# Patient Record
Sex: Female | Born: 1950
Health system: Southern US, Community
[De-identification: ages and names within clinical notes are randomized; demographics above are authoritative.]

## PROBLEM LIST (undated history)

## (undated) DIAGNOSIS — B1910 Unspecified viral hepatitis B without hepatic coma: Secondary | ICD-10-CM

## (undated) DIAGNOSIS — R011 Cardiac murmur, unspecified: Secondary | ICD-10-CM

## (undated) DIAGNOSIS — D649 Anemia, unspecified: Secondary | ICD-10-CM

## (undated) DIAGNOSIS — Z923 Personal history of irradiation: Secondary | ICD-10-CM

## (undated) DIAGNOSIS — C189 Malignant neoplasm of colon, unspecified: Secondary | ICD-10-CM

## (undated) DIAGNOSIS — I499 Cardiac arrhythmia, unspecified: Secondary | ICD-10-CM

## (undated) DIAGNOSIS — D219 Benign neoplasm of connective and other soft tissue, unspecified: Secondary | ICD-10-CM

## (undated) DIAGNOSIS — Z8489 Family history of other specified conditions: Secondary | ICD-10-CM

## (undated) DIAGNOSIS — N809 Endometriosis, unspecified: Secondary | ICD-10-CM

## (undated) DIAGNOSIS — C801 Malignant (primary) neoplasm, unspecified: Secondary | ICD-10-CM

## (undated) DIAGNOSIS — B1920 Unspecified viral hepatitis C without hepatic coma: Secondary | ICD-10-CM

## (undated) DIAGNOSIS — C50919 Malignant neoplasm of unspecified site of unspecified female breast: Secondary | ICD-10-CM

## (undated) DIAGNOSIS — J302 Other seasonal allergic rhinitis: Secondary | ICD-10-CM

## (undated) DIAGNOSIS — M1990 Unspecified osteoarthritis, unspecified site: Secondary | ICD-10-CM

## (undated) HISTORY — PX: TUBAL LIGATION: SHX77

## (undated) HISTORY — DX: Unspecified viral hepatitis C without hepatic coma: B19.20

## (undated) HISTORY — DX: Endometriosis, unspecified: N80.9

## (undated) HISTORY — DX: Cardiac murmur, unspecified: R01.1

## (undated) HISTORY — PX: ABDOMINAL HYSTERECTOMY: SHX81

## (undated) HISTORY — DX: Unspecified viral hepatitis B without hepatic coma: B19.10

## (undated) HISTORY — DX: Benign neoplasm of connective and other soft tissue, unspecified: D21.9

## (undated) HISTORY — PX: COLONOSCOPY: SHX174

## (undated) HISTORY — DX: Malignant neoplasm of colon, unspecified: C18.9

## (undated) HISTORY — DX: Unspecified osteoarthritis, unspecified site: M19.90

---

## 1997-06-02 DIAGNOSIS — B1920 Unspecified viral hepatitis C without hepatic coma: Secondary | ICD-10-CM

## 1997-06-02 HISTORY — DX: Unspecified viral hepatitis C without hepatic coma: B19.20

## 2003-06-03 DIAGNOSIS — C189 Malignant neoplasm of colon, unspecified: Secondary | ICD-10-CM

## 2003-06-03 DIAGNOSIS — C801 Malignant (primary) neoplasm, unspecified: Secondary | ICD-10-CM

## 2003-06-03 HISTORY — DX: Malignant neoplasm of colon, unspecified: C18.9

## 2003-06-03 HISTORY — DX: Malignant (primary) neoplasm, unspecified: C80.1

## 2004-10-15 ENCOUNTER — Ambulatory Visit: Payer: Self-pay | Admitting: Family Medicine

## 2004-10-18 ENCOUNTER — Ambulatory Visit: Payer: Self-pay | Admitting: Gastroenterology

## 2004-11-04 ENCOUNTER — Ambulatory Visit: Payer: Self-pay | Admitting: Gastroenterology

## 2005-12-02 ENCOUNTER — Ambulatory Visit: Payer: Self-pay | Admitting: Gastroenterology

## 2007-08-31 ENCOUNTER — Ambulatory Visit: Payer: Self-pay | Admitting: Family Medicine

## 2008-08-31 ENCOUNTER — Ambulatory Visit: Payer: Self-pay | Admitting: Internal Medicine

## 2008-09-12 ENCOUNTER — Ambulatory Visit: Payer: Self-pay | Admitting: Internal Medicine

## 2009-07-20 ENCOUNTER — Ambulatory Visit: Payer: Self-pay | Admitting: Internal Medicine

## 2010-04-24 ENCOUNTER — Ambulatory Visit: Payer: Self-pay | Admitting: Gastroenterology

## 2010-05-06 ENCOUNTER — Ambulatory Visit: Payer: Self-pay | Admitting: Gastroenterology

## 2012-05-20 ENCOUNTER — Ambulatory Visit: Payer: Self-pay | Admitting: Internal Medicine

## 2014-04-26 DIAGNOSIS — Z8619 Personal history of other infectious and parasitic diseases: Secondary | ICD-10-CM | POA: Insufficient documentation

## 2014-12-06 ENCOUNTER — Other Ambulatory Visit: Payer: Self-pay | Admitting: Internal Medicine

## 2014-12-06 DIAGNOSIS — Z1231 Encounter for screening mammogram for malignant neoplasm of breast: Secondary | ICD-10-CM

## 2015-04-23 DIAGNOSIS — B182 Chronic viral hepatitis C: Secondary | ICD-10-CM | POA: Insufficient documentation

## 2015-04-23 DIAGNOSIS — C801 Malignant (primary) neoplasm, unspecified: Secondary | ICD-10-CM | POA: Insufficient documentation

## 2015-06-03 DIAGNOSIS — C50919 Malignant neoplasm of unspecified site of unspecified female breast: Secondary | ICD-10-CM

## 2015-06-03 DIAGNOSIS — Z923 Personal history of irradiation: Secondary | ICD-10-CM

## 2015-06-03 HISTORY — DX: Malignant neoplasm of unspecified site of unspecified female breast: C50.919

## 2015-06-03 HISTORY — DX: Personal history of irradiation: Z92.3

## 2015-08-02 ENCOUNTER — Ambulatory Visit
Admission: RE | Admit: 2015-08-02 | Discharge: 2015-08-02 | Disposition: A | Discharge status: Home / Self Care | Payer: BC Managed Care – PPO | Source: Ambulatory Visit | Attending: Internal Medicine | Admitting: Internal Medicine

## 2015-08-02 DIAGNOSIS — Z1231 Encounter for screening mammogram for malignant neoplasm of breast: Secondary | ICD-10-CM | POA: Diagnosis not present

## 2015-08-02 HISTORY — DX: Malignant (primary) neoplasm, unspecified: C80.1

## 2015-08-06 ENCOUNTER — Other Ambulatory Visit: Payer: Self-pay | Admitting: Internal Medicine

## 2015-08-06 DIAGNOSIS — R928 Other abnormal and inconclusive findings on diagnostic imaging of breast: Secondary | ICD-10-CM

## 2015-08-09 ENCOUNTER — Other Ambulatory Visit: Payer: Self-pay | Admitting: Internal Medicine

## 2015-08-09 ENCOUNTER — Ambulatory Visit
Admission: RE | Admit: 2015-08-09 | Discharge: 2015-08-09 | Disposition: A | Discharge status: Home / Self Care | Payer: BC Managed Care – PPO | Source: Ambulatory Visit | Attending: Internal Medicine | Admitting: Internal Medicine

## 2015-08-09 DIAGNOSIS — R928 Other abnormal and inconclusive findings on diagnostic imaging of breast: Secondary | ICD-10-CM | POA: Diagnosis present

## 2015-08-09 DIAGNOSIS — C50912 Malignant neoplasm of unspecified site of left female breast: Secondary | ICD-10-CM | POA: Insufficient documentation

## 2015-08-09 DIAGNOSIS — N6320 Unspecified lump in the left breast, unspecified quadrant: Secondary | ICD-10-CM

## 2015-08-09 DIAGNOSIS — N63 Unspecified lump in breast: Secondary | ICD-10-CM | POA: Insufficient documentation

## 2015-08-09 HISTORY — PX: BREAST BIOPSY: SHX20

## 2015-08-10 ENCOUNTER — Ambulatory Visit: Payer: BC Managed Care – PPO

## 2015-08-17 ENCOUNTER — Inpatient Hospital Stay: Admission: RE | Admit: 2015-08-17 | Payer: BC Managed Care – PPO | Source: Ambulatory Visit

## 2015-08-17 ENCOUNTER — Ambulatory Visit: Payer: BC Managed Care – PPO

## 2015-08-20 ENCOUNTER — Encounter: Payer: Self-pay | Admitting: Oncology

## 2015-08-20 ENCOUNTER — Encounter: Payer: Self-pay | Admitting: *Deleted

## 2015-08-20 ENCOUNTER — Inpatient Hospital Stay: Payer: BC Managed Care – PPO | Attending: Oncology | Admitting: Oncology

## 2015-08-20 VITALS — BP 135/92 | HR 102 | Temp 98.2°F | Resp 18 | Wt <= 1120 oz

## 2015-08-20 DIAGNOSIS — Z87891 Personal history of nicotine dependence: Secondary | ICD-10-CM | POA: Insufficient documentation

## 2015-08-20 DIAGNOSIS — R011 Cardiac murmur, unspecified: Secondary | ICD-10-CM | POA: Diagnosis not present

## 2015-08-20 DIAGNOSIS — F419 Anxiety disorder, unspecified: Secondary | ICD-10-CM

## 2015-08-20 DIAGNOSIS — Z79899 Other long term (current) drug therapy: Secondary | ICD-10-CM | POA: Insufficient documentation

## 2015-08-20 DIAGNOSIS — C50412 Malignant neoplasm of upper-outer quadrant of left female breast: Secondary | ICD-10-CM | POA: Diagnosis not present

## 2015-08-20 DIAGNOSIS — Z8619 Personal history of other infectious and parasitic diseases: Secondary | ICD-10-CM | POA: Insufficient documentation

## 2015-08-20 DIAGNOSIS — M129 Arthropathy, unspecified: Secondary | ICD-10-CM | POA: Insufficient documentation

## 2015-08-20 DIAGNOSIS — Z85038 Personal history of other malignant neoplasm of large intestine: Secondary | ICD-10-CM | POA: Diagnosis not present

## 2015-08-20 DIAGNOSIS — Z170 Estrogen receptor positive status [ER+]: Secondary | ICD-10-CM | POA: Diagnosis not present

## 2015-08-20 DIAGNOSIS — L988 Other specified disorders of the skin and subcutaneous tissue: Secondary | ICD-10-CM | POA: Diagnosis not present

## 2015-08-20 NOTE — Progress Notes (Signed)
  Oncology Nurse Navigator Documentation  Navigator Location: [facility]Med Onc (08/20/15 1000) Navigator Encounter Type: Initial MedOnc (08/20/15 1000)   Abnormal Finding Date: 08/09/15 (08/20/15 1000) Confirmed Diagnosis Date: 08/14/15 (08/20/15 1000)     Patient Visit Type: MedOnc (08/20/15 1000)   Barriers/Navigation Needs: Coordination of Care;Education (08/20/15 1000) Education: Coping with Diagnosis/ Prognosis;Newly Diagnosed Cancer Education;lymphedema class (08/20/15 1000)              Acuity: Level 2 (08/20/15 1000)         Time Spent with Patient: 90 (08/20/15 1000)   Met with patient and her husband today during her initial medical oncology consultation.  Gave patient breast cancer educational literature, "My Breast Cancer Treatment Handbook" by Josephine Igo, RN.  Offered support.  Scheduled patient to see Dr. Tamala Julian on 08/22/15 @ 4:00 for her surgical consultation.  States he will biopsy the left nipple at that time.  Informed patient of appointment.  Patient is to call if she has any questions or needs.

## 2015-08-20 NOTE — Progress Notes (Signed)
Patient here today as new evaluation regarding left breast cancer.  Patient referred by Dr. Vonzella Nipple.  Patient has had Hep B and now also Hep C.  Patient had bx and [place] on 3-9 with Dr. Manuella Ghazi.

## 2015-08-22 LAB — SURGICAL PATHOLOGY

## 2015-08-23 ENCOUNTER — Encounter: Payer: Self-pay | Admitting: *Deleted

## 2015-08-23 NOTE — Progress Notes (Addendum)
Patient called yesterday wanting her Her2 results.  They were not available until today.  I have left her a message to return my call.  Patient returned my call.  Informed her of her negative Her2 results.  She was excited.  She is to follow-up with Dr. Grayland Ormond after surgery for final treatment plan.

## 2015-08-24 ENCOUNTER — Other Ambulatory Visit: Payer: Self-pay | Admitting: Surgery

## 2015-08-24 ENCOUNTER — Ambulatory Visit: Payer: BC Managed Care – PPO

## 2015-08-24 DIAGNOSIS — C50412 Malignant neoplasm of upper-outer quadrant of left female breast: Secondary | ICD-10-CM

## 2015-08-28 ENCOUNTER — Encounter: Payer: Self-pay | Admitting: *Deleted

## 2015-08-28 ENCOUNTER — Other Ambulatory Visit: Payer: BC Managed Care – PPO

## 2015-08-28 NOTE — Patient Instructions (Signed)
  Your procedure is scheduled on: 09-04-15 (TUESDAY) Report to [facility] @ 8 AM   Remember: Instructions that are not followed completely may result in serious medical risk, up to and including death, or upon the discretion of your surgeon and anesthesiologist your surgery may need to be rescheduled.    _X___ 1. Do not eat food or drink liquids after midnight. No gum chewing or hard candies.     _X___ 2. No Alcohol for 24 hours before or after surgery.   ____ 3. Bring all medications with you on the day of surgery if instructed.    _X___ 4. Notify your doctor if there is any change in your medical condition     (cold, fever, infections).     Do not wear jewelry, make-up, hairpins, clips or nail polish.  Do not wear lotions, powders, or perfumes. You may wear deodorant.  Do not shave 48 hours prior to surgery. Men may shave face and neck.  Do not bring valuables to the hospital.    [facility] is not responsible for any belongings or valuables.               Contacts, dentures or bridgework may not be worn into surgery.  Leave your suitcase in the car. After surgery it may be brought to your room.  For patients admitted to the hospital, discharge time is determined by your treatment team.   Patients discharged the day of surgery will not be allowed to drive home.   Please read over the following fact sheets that you were given:      ____ Take these medicines the morning of surgery with A SIP OF WATER:    1. MAY TAKE ATIVAN IF NEEDED DAY OF SURGERY WITH A SMALL SIP OF WATER  2.   3.   4.  5.  6.  ____ Fleet Enema (as directed)   _X___ Use CHG Soap as directed  ____ Use inhalers on the day of surgery  ____ Stop metformin 2 days prior to surgery    ____ Take 1/2 of usual insulin dose the night before surgery and none on the morning of surgery.   ____ Stop Coumadin/Plavix/aspirin-N/A  _X___ Stop Anti-inflammatories-STOP ALEVE 19 HOURS PRIOR TO SURGERY PER DR  Tamala Julian   ____ Stop supplements until after surgery.    ____ Bring C-Pap to the hospital.

## 2015-08-29 ENCOUNTER — Encounter
Admission: RE | Admit: 2015-08-29 | Discharge: 2015-08-29 | Disposition: A | Discharge status: Home / Self Care | Payer: BC Managed Care – PPO | Source: Ambulatory Visit | Attending: Surgery | Admitting: Surgery

## 2015-08-29 DIAGNOSIS — R002 Palpitations: Secondary | ICD-10-CM | POA: Insufficient documentation

## 2015-08-29 DIAGNOSIS — Z01810 Encounter for preprocedural cardiovascular examination: Secondary | ICD-10-CM | POA: Insufficient documentation

## 2015-08-29 DIAGNOSIS — Z01812 Encounter for preprocedural laboratory examination: Secondary | ICD-10-CM | POA: Insufficient documentation

## 2015-08-29 LAB — PROTIME-INR
INR: 0.95
PROTHROMBIN TIME: 12.9 s (ref 11.4–15.0)

## 2015-09-02 DIAGNOSIS — C50412 Malignant neoplasm of upper-outer quadrant of left female breast: Secondary | ICD-10-CM | POA: Insufficient documentation

## 2015-09-02 NOTE — Progress Notes (Signed)
[place]  Telephone:(336) [phone] Fax:(336) [phone]  ID: Debra Joseph OB: 09-07-1950  MR#: [id number]  MPN#:[id number]  Patient Care Team: Madelyn Brunner, MD as PCP - General (Internal Medicine)  CHIEF COMPLAINT:  Chief Complaint  Patient presents with  . New Evaluation    INTERVAL HISTORY: Patient is a 65 year old female who was noted to have a suspicious lesion on routine screening mammogram of her left breast. Subsequent biopsy revealed invasive adenocarcinoma. Currently patient is anxious, but otherwise feels well. She has no neurologic complaint. She denies any recent fevers or illnesses. She denies any pain. She has no chest pain or shortness of breath. She denies any nausea, vomiting, constipation, or diarrhea. She has no urinary complaints. Patient otherwise feels well and offers no further specific complaints.  REVIEW OF SYSTEMS:   Review of Systems  Constitutional: Negative.  Negative for fever, weight loss and malaise/fatigue.  Respiratory: Negative.  Negative for sputum production.   Cardiovascular: Negative.  Negative for chest pain.  Gastrointestinal: Negative.   Genitourinary: Negative.   Musculoskeletal: Negative.   Neurological: Negative.  Negative for weakness.  Psychiatric/Behavioral: The patient is nervous/anxious.     As per HPI. Otherwise, a complete review of systems is negatve.  PAST MEDICAL HISTORY: Past Medical History  Diagnosis Date  . Cancer ([place]) 2005    COLON CA  . Colon cancer ([place])   . Arthritis   . Heart murmur   . Hepatitis B     AGE 85  . Hepatitis C 1999    NO CIRRHOSIS  . Anemia   . Family history of adverse reaction to anesthesia     MOM-NAUSEATED  . Seasonal allergies   . Dysrhythmia     PALPITATIONS    PAST SURGICAL HISTORY: Past Surgical History  Procedure Laterality Date  . Breast biopsy Left 08/09/2015    2 areas path pending  . Tubal ligation    . Abdominal hysterectomy    . Colonoscopy        MULTIPLE    FAMILY HISTORY Family History  Problem Relation Age of Onset  . Breast cancer Neg Hx        ADVANCED DIRECTIVES:    HEALTH MAINTENANCE: Social History  Substance Use Topics  . Smoking status: Former Smoker -- 0.50 packs/day for 35 years    Types: E-cigarettes  . Smokeless tobacco: Former Systems developer    Quit date: 08/19/2012     Comment: VAPOR CIG   . Alcohol Use: No     Colonoscopy:  PAP:  Bone density:  Lipid panel:  Allergies  Allergen Reactions  . Other Other (See Comments)    DARVOCET    Current Outpatient Prescriptions  Medication Sig Dispense Refill  . LORazepam (ATIVAN) 0.5 MG tablet Take 0.5 mg by mouth every 8 (eight) hours as needed.     . Multiple Vitamins-Minerals (CENTRUM ULTRA WOMENS PO) Take 1 tablet by mouth daily.     . diphenhydrAMINE (BENADRYL) 25 MG tablet Take 25 mg by mouth every 6 (six) hours as needed.    . fluticasone (FLONASE) 50 MCG/ACT nasal spray Place 2 sprays into both nostrils as needed for allergies or rhinitis.    . naproxen sodium (RA NAPROXEN SODIUM) 220 MG tablet Take by mouth.     No current facility-administered medications for this visit.    OBJECTIVE: Filed Vitals:   08/20/15 0907  BP: 135/92  Pulse: 102  Temp: 98.2 F (36.8 C)  Resp: 18  There is no height on file to calculate BMI.    ECOG FS:0 - Asymptomatic  General: Well-developed, well-nourished, no acute distress. Eyes: Pink conjunctiva, anicteric sclera. HEENT: Normocephalic, moist mucous membranes, clear oropharnyx. Breasts: Minimally palpable abnormality left breast. Lungs: Clear to auscultation bilaterally. Heart: Regular rate and rhythm. No rubs, murmurs, or gallops. Abdomen: Soft, nontender, nondistended. No organomegaly noted, normoactive bowel sounds. Musculoskeletal: No edema, cyanosis, or clubbing. Neuro: Alert, answering all questions appropriately. Cranial nerves grossly intact. Skin: Mildly erythematous subcentimeter  lesion on  breast. Psych: Normal affect. Lymphatics: No cervical, calvicular, axillary or inguinal LAD.   LAB RESULTS:  No results found for: NA, K, CL, CO2, GLUCOSE, BUN, CREATININE, CALCIUM, PROT, ALBUMIN, AST, ALT, ALKPHOS, BILITOT, GFRNONAA, GFRAA  No results found for: WBC, NEUTROABS, HGB, HCT, MCV, PLT   STUDIES: Mm Digital Diagnostic Unilat L  08/09/2015  CLINICAL DATA:  65 year old female status post ultrasound-guided biopsies of left breast masses at 1 o'clock, 6 cm from the nipple and at 1 o'clock, 7 cm from the nipple EXAM: DIAGNOSTIC LEFT MAMMOGRAM POST ULTRASOUND BIOPSY COMPARISON:  Previous exam(s). FINDINGS: Mammographic images were obtained following ultrasound guided biopsies of left breast masses at 1 o'clock, 6 cm from the nipple and 1 o'clock, 7 cm from the nipple. The wing shaped biopsy marker is in the location of the distortion within the upper, outer left breast. The coil shaped biopsy marker is thought to be approximately 1 cm lateral to the expected location within the upper, outer left breast. IMPRESSION: Marker placement as above. Final Assessment: Post Procedure Mammograms for Marker Placement Electronically Signed   By: Pamelia Hoit M.D.   On: 08/09/2015 14:19   US Breast Ltd Uni Left Inc Axilla  08/09/2015  ADDENDUM REPORT: 08/09/2015 16:20 ADDENDUM: At the time of exam, the patient also noted that she had a skin lesion of the left superior areola which was evaluated by Dr. Nehemiah Massed in January. She states that the area was felt likely benign and was treated with liquid nitrogen. Estill Bamberg, a patient care coordinator in Dr. Alveria Apley office was contacted via telephone by Dr. Brigitte Pulse. She stated that the patient missed her follow-up appointment in February. She will let Dr. Nehemiah Massed know that the patient had breast biopsies today, and they will reach out to the patient to reschedule her follow-up appointment if deemed necessary. If either of the patient's breast biopsies demonstrate  malignancy, consideration of biopsy of the areolar abnormality is recommended. Electronically Signed   By: Pamelia Hoit M.D.   On: 08/09/2015 16:20  08/09/2015  ADDENDUM REPORT: 08/09/2015 16:20 ADDENDUM: At the time of exam, the patient also noted that she had a skin lesion of the left superior areola which was evaluated by Dr. Nehemiah Massed in January. She states that the area was felt likely benign and was treated with liquid nitrogen. Estill Bamberg, a patient care coordinator in Dr. Alveria Apley office was contacted via telephone by Dr. Brigitte Pulse. She stated that the patient missed her follow-up appointment in February. She will let Dr. Nehemiah Massed know that the patient had breast biopsies today, and they will reach out to the patient to reschedule her follow-up appointment if deemed necessary. If either of the patient's breast biopsies demonstrate malignancy, consideration of biopsy of the areolar abnormality is recommended. Electronically Signed   By: Pamelia Hoit M.D.   On: 08/09/2015 16:20  08/09/2015  CLINICAL DATA:  65 year old female, callback from screening mammogram for possible left breast distortion EXAM: DIGITAL DIAGNOSTIC LEFT MAMMOGRAM WITH 3D TOMOSYNTHESIS  WITH CAD ULTRASOUND LEFT BREAST COMPARISON:  08/02/2015, 05/20/2012, additional prior studies dating back to 08/31/2007 ACR Breast Density Category c: The breast tissue is heterogeneously dense, which may obscure small masses. FINDINGS: CC, MLO, and spot compression CC and MLO views of the left breast with tomosynthesis were performed. A distortion with associated calcifications is noted within the upper, outer left breast, posterior depth, corresponding to the questioned abnormality on the screening mammogram. Mammographic images were processed with CAD. On physical exam, an area of thickening is palpated in the upper, outer left breast at 1 o'clock, 6 cm from the nipple. Targeted ultrasound is performed, showing an irregular, hypoechoic mass at 1 o'clock, 6 cm from the  nipple measuring 1.9 x 1.1 x 1.8 cm. An additional irregular, hypoechoic lesion is noted at 1 o'clock, 7 cm from the nipple measuring 6 x 4 x 4 mm. This mass is approximately 1.1 cm from the larger mass at 1 o'clock, 6 cm from the nipple. No enlarged axillary lymph nodes are identified. IMPRESSION: 1. Highly suspicious left breast mass at 1 o'clock, 6 cm from the nipple. 2. Indeterminate left breast mass at 1 o'clock, 7 cm from the nipple. RECOMMENDATION: Ultrasound-guided biopsies of the left breast masses at 1 o'clock, 6 cm from the nipple and 1 o'clock, 7 cm from the nipple. I have discussed the findings and recommendations with the patient. Results were also provided in writing at the conclusion of the visit. If applicable, a reminder letter will be sent to the patient regarding the next appointment. BI-RADS CATEGORY  5: Highly suggestive of malignancy. Electronically Signed: By: Pamelia Hoit M.D. On: 08/09/2015 12:01   Mm Diag Breast Tomo Uni Left  08/09/2015  ADDENDUM REPORT: 08/09/2015 16:20 ADDENDUM: At the time of exam, the patient also noted that she had a skin lesion of the left superior areola which was evaluated by Dr. Nehemiah Massed in January. She states that the area was felt likely benign and was treated with liquid nitrogen. Estill Bamberg, a patient care coordinator in Dr. Alveria Apley office was contacted via telephone by Dr. Brigitte Pulse. She stated that the patient missed her follow-up appointment in February. She will let Dr. Nehemiah Massed know that the patient had breast biopsies today, and they will reach out to the patient to reschedule her follow-up appointment if deemed necessary. If either of the patient's breast biopsies demonstrate malignancy, consideration of biopsy of the areolar abnormality is recommended. Electronically Signed   By: Pamelia Hoit M.D.   On: 08/09/2015 16:20  08/09/2015  ADDENDUM REPORT: 08/09/2015 16:20 ADDENDUM: At the time of exam, the patient also noted that she had a skin lesion of the left  superior areola which was evaluated by Dr. Nehemiah Massed in January. She states that the area was felt likely benign and was treated with liquid nitrogen. Estill Bamberg, a patient care coordinator in Dr. Alveria Apley office was contacted via telephone by Dr. Brigitte Pulse. She stated that the patient missed her follow-up appointment in February. She will let Dr. Nehemiah Massed know that the patient had breast biopsies today, and they will reach out to the patient to reschedule her follow-up appointment if deemed necessary. If either of the patient's breast biopsies demonstrate malignancy, consideration of biopsy of the areolar abnormality is recommended. Electronically Signed   By: Pamelia Hoit M.D.   On: 08/09/2015 16:20  08/09/2015  CLINICAL DATA:  65 year old female, callback from screening mammogram for possible left breast distortion EXAM: DIGITAL DIAGNOSTIC LEFT MAMMOGRAM WITH 3D TOMOSYNTHESIS WITH CAD ULTRASOUND LEFT BREAST COMPARISON:  08/02/2015, 05/20/2012, additional prior studies dating back to 08/31/2007 ACR Breast Density Category c: The breast tissue is heterogeneously dense, which may obscure small masses. FINDINGS: CC, MLO, and spot compression CC and MLO views of the left breast with tomosynthesis were performed. A distortion with associated calcifications is noted within the upper, outer left breast, posterior depth, corresponding to the questioned abnormality on the screening mammogram. Mammographic images were processed with CAD. On physical exam, an area of thickening is palpated in the upper, outer left breast at 1 o'clock, 6 cm from the nipple. Targeted ultrasound is performed, showing an irregular, hypoechoic mass at 1 o'clock, 6 cm from the nipple measuring 1.9 x 1.1 x 1.8 cm. An additional irregular, hypoechoic lesion is noted at 1 o'clock, 7 cm from the nipple measuring 6 x 4 x 4 mm. This mass is approximately 1.1 cm from the larger mass at 1 o'clock, 6 cm from the nipple. No enlarged axillary lymph nodes are  identified. IMPRESSION: 1. Highly suspicious left breast mass at 1 o'clock, 6 cm from the nipple. 2. Indeterminate left breast mass at 1 o'clock, 7 cm from the nipple. RECOMMENDATION: Ultrasound-guided biopsies of the left breast masses at 1 o'clock, 6 cm from the nipple and 1 o'clock, 7 cm from the nipple. I have discussed the findings and recommendations with the patient. Results were also provided in writing at the conclusion of the visit. If applicable, a reminder letter will be sent to the patient regarding the next appointment. BI-RADS CATEGORY  5: Highly suggestive of malignancy. Electronically Signed: By: Pamelia Hoit M.D. On: 08/09/2015 12:01   [place] Lt Breast Bx W Loc Dev 1st Lesion Img Bx Spec US Guide  08/15/2015  ADDENDUM REPORT: 08/15/2015 14:40 ADDENDUM: Pathology of the left breast revealed A. BREAST, LEFT 1:00, 6 CM FN; BIOPSY: INVASIVE MAMMARY CARCINOMA, NO SPECIAL TYPE. PRELIMINARY GRADE: 1 ([place] GRADE) Comment: The definitive grade will be assigned on the excisional specimen. These findings were communicated to [facility] in Dr. Thomes Dinning office on 08/10/2015 by pathologist. Read back procedure was performed. This was found to be concordant by Dr. Brigitte Pulse. Recommendations:  Surgical and oncology referral. The patient was contacted by Jetta Lout, [facility]) on 08/15/15. She stated she has done well following the biopsy with only a small bruise, but no bleeding or palpable hematoma. Post biopsy instructions were reviewed with the patient. All of her questions were answered. She was encouraged to call the [place] [facility] with any further questions or concerns. The patient stated she had been given the results by Dr. Gilford Rile on 08/13/15. She is awaiting an appointment at the [facility]. Addendum by Jetta Lout, RRA on 08/15/15. Electronically Signed   By: Pamelia Hoit M.D.   On: 08/15/2015 14:40  08/15/2015  CLINICAL DATA:   65 year old female for ultrasound-guided biopsy of a left breast mass at 1 o'clock, 6 cm from the nipple EXAM: ULTRASOUND GUIDED LEFT BREAST CORE NEEDLE BIOPSY COMPARISON:  Previous exam(s). FINDINGS: I met with the patient and we discussed the procedure of ultrasound-guided biopsy, including benefits and alternatives. We discussed the high likelihood of a successful procedure. We discussed the risks of the procedure, including infection, bleeding, tissue injury, clip migration, and inadequate sampling. Informed written consent was given. The usual time-out protocol was performed immediately prior to the procedure. Using sterile technique and 2% Lidocaine as local anesthetic, under direct ultrasound visualization, a 14 gauge spring-loaded device was used to perform  biopsy of a suspicious left breast mass at 1 o'clock, 6 cm from the nipple using a lateral to medial approach. At the conclusion of the procedure a wing shaped tissue marker clip was deployed into the biopsy cavity. Follow up 2 view mammogram was performed and dictated separately. IMPRESSION: Ultrasound guided biopsy of a suspicious left breast mass at 1 o'clock, 6 cm from the nipple. No apparent complications. Electronically Signed: By: Pamelia Hoit M.D. On: 08/09/2015 13:52   [place] Lt Breast Bx W Loc Dev Ea Add Lesion Img Bx Spec US Guide  08/15/2015  ADDENDUM REPORT: 08/15/2015 14:33 ADDENDUM: Pathology of the left breast biopsy revealed B. BREAST, LEFT 1:00, 7 CM FN; BIOPSY: BENIGN ADIPOSE TISSUE. Comment: The presence of benign adipose tissue may represent an intramammary lipoma. Correlation with radiographic findings is required. This was found to be concordant by Dr. Raul Del impression and notes and confirmed by Dr. Joneen Caraway. Recommendations: Surgical and oncology referral for positive findings in left breast at 6 CM from nipple. The patient was contacted by Jetta Lout, [facility]) on 08/15/15 for a post biopsy check. She stated she has  done well following the biopsy with only a small bruise but no bleeding or palpable hematoma. Post biopsy instructions were reviewed with the patient. All of her questions were answered. She was encouraged to call the [place] [facility] with any further questions or concerns. She stated she was contacted by Dr. Gilford Rile on 08/13/15 with the results. She is awaiting a referral to the [facility]. Addendum by Jetta Lout, RRA on 08/15/15. Electronically Signed   By: Pamelia Hoit M.D.   On: 08/15/2015 14:33  08/15/2015  CLINICAL DATA:  65 year old female for ultrasound-guided biopsy of an indeterminate left breast mass at 1 o'clock, 7 cm from the nipple EXAM: ULTRASOUND GUIDED LEFT BREAST CORE NEEDLE BIOPSY COMPARISON:  Previous exam(s). PROCEDURE: I met with the patient and we discussed the procedure of ultrasound-guided biopsy, including benefits and alternatives. We discussed the high likelihood of a successful procedure. We discussed the risks of the procedure including infection, bleeding, tissue injury, clip migration, and inadequate sampling. Informed written consent was given. The usual time-out protocol was performed immediately prior to the procedure. Using sterile technique and 2% Lidocaine as local anesthetic, under direct ultrasound visualization, a 12 gauge vacuum-assisted device was used to perform biopsy of an indeterminate left breast mass at 1 o'clock, 7 cm from the nipple using a lateral to medial approach. At the conclusion of the procedure, a coil shaped tissue marker clip was deployed into the biopsy cavity. Follow-up 2-view mammogram was performed and dictated separately. IMPRESSION: Ultrasound-guided biopsy of an indeterminate left breast mass at 1 o'clock, 7 cm from the nipple. No apparent complications. Electronically Signed: By: Pamelia Hoit M.D. On: 08/09/2015 14:16    ASSESSMENT: Clinical stage Ia ER/PR positive, HER-2 negative  adenocarcinoma of the left breast.  PLAN:    1. Breast cancer: Given the stage in size of patient's malignancy on mammogram, have recommended surgical consultation and lumpectomy prior to other treatment. Patient was reported as HER-2 2+, but reflex to FISH revealed ratio of 1.13 therefore negative. Will send Oncotype DX to determine if chemotherapy is necessary. If patient proceeds with lumpectomy she will require adjuvant XRT. Given the ER/PR status of her tumor, patient will also benefit from an aromatase inhibitor for 5 years at the conclusions of her treatments. Patient will return to clinic one to 2  weeks after her surgery to discuss her final pathology results and additional treatment planning. 2. Left breast skin lesion: Does not appear to be malignant, but have recommended consideration of biopsy by surgery.  Approximately 45 minutes was spent in discussion of which greater than 50% was consultation.  Patient expressed understanding and was in agreement with this plan. She also understands that She can call clinic at any time with any questions, concerns, or complaints.   Breast cancer, left [facility])   Staging form: Breast, AJCC 7th Edition     Clinical stage from 09/02/2015: Stage IA (T1c, N0, M0) - Signed by Lloyd Huger, MD on 09/02/2015   Lloyd Huger, MD   09/02/2015 8:55 AM

## 2015-09-03 ENCOUNTER — Encounter: Payer: Self-pay | Admitting: Diagnostic Radiology

## 2015-09-03 ENCOUNTER — Encounter: Payer: Self-pay | Admitting: *Deleted

## 2015-09-04 ENCOUNTER — Ambulatory Visit: Payer: BC Managed Care – PPO | Admitting: Anesthesiology

## 2015-09-04 ENCOUNTER — Encounter
Admission: RE | Disposition: A | Discharge status: Home / Self Care | Payer: Self-pay | Source: Ambulatory Visit | Attending: Surgery

## 2015-09-04 ENCOUNTER — Ambulatory Visit
Admission: RE | Admit: 2015-09-04 | Discharge: 2015-09-04 | Disposition: A | Discharge status: Home / Self Care | Payer: BC Managed Care – PPO | Source: Ambulatory Visit | Attending: Surgery | Admitting: Surgery

## 2015-09-04 ENCOUNTER — Encounter: Payer: Self-pay | Admitting: *Deleted

## 2015-09-04 ENCOUNTER — Encounter
Admission: RE | Admit: 2015-09-04 | Discharge: 2015-09-04 | Disposition: A | Discharge status: Home / Self Care | Payer: BC Managed Care – PPO | Source: Ambulatory Visit | Attending: Surgery | Admitting: Surgery

## 2015-09-04 ENCOUNTER — Ambulatory Visit: Payer: BC Managed Care – PPO

## 2015-09-04 DIAGNOSIS — M1990 Unspecified osteoarthritis, unspecified site: Secondary | ICD-10-CM | POA: Insufficient documentation

## 2015-09-04 DIAGNOSIS — L309 Dermatitis, unspecified: Secondary | ICD-10-CM | POA: Diagnosis not present

## 2015-09-04 DIAGNOSIS — Z90710 Acquired absence of both cervix and uterus: Secondary | ICD-10-CM | POA: Insufficient documentation

## 2015-09-04 DIAGNOSIS — Z85038 Personal history of other malignant neoplasm of large intestine: Secondary | ICD-10-CM | POA: Insufficient documentation

## 2015-09-04 DIAGNOSIS — Z800 Family history of malignant neoplasm of digestive organs: Secondary | ICD-10-CM | POA: Diagnosis not present

## 2015-09-04 DIAGNOSIS — Z170 Estrogen receptor positive status [ER+]: Secondary | ICD-10-CM | POA: Diagnosis not present

## 2015-09-04 DIAGNOSIS — D649 Anemia, unspecified: Secondary | ICD-10-CM | POA: Diagnosis not present

## 2015-09-04 DIAGNOSIS — C50412 Malignant neoplasm of upper-outer quadrant of left female breast: Secondary | ICD-10-CM

## 2015-09-04 DIAGNOSIS — B182 Chronic viral hepatitis C: Secondary | ICD-10-CM | POA: Diagnosis not present

## 2015-09-04 DIAGNOSIS — D0512 Intraductal carcinoma in situ of left breast: Secondary | ICD-10-CM | POA: Diagnosis not present

## 2015-09-04 DIAGNOSIS — Z8249 Family history of ischemic heart disease and other diseases of the circulatory system: Secondary | ICD-10-CM | POA: Diagnosis not present

## 2015-09-04 DIAGNOSIS — F419 Anxiety disorder, unspecified: Secondary | ICD-10-CM | POA: Diagnosis not present

## 2015-09-04 DIAGNOSIS — Z87891 Personal history of nicotine dependence: Secondary | ICD-10-CM | POA: Diagnosis not present

## 2015-09-04 DIAGNOSIS — Z885 Allergy status to narcotic agent status: Secondary | ICD-10-CM | POA: Diagnosis not present

## 2015-09-04 DIAGNOSIS — Z79899 Other long term (current) drug therapy: Secondary | ICD-10-CM | POA: Insufficient documentation

## 2015-09-04 HISTORY — PX: BREAST LUMPECTOMY: SHX2

## 2015-09-04 HISTORY — DX: Other seasonal allergic rhinitis: J30.2

## 2015-09-04 HISTORY — DX: Family history of other specified conditions: Z84.89

## 2015-09-04 HISTORY — PX: MASTECTOMY W/ SENTINEL NODE BIOPSY: SHX2001

## 2015-09-04 HISTORY — PX: EXCISION OF BREAST LESION: SHX6676

## 2015-09-04 HISTORY — DX: Cardiac arrhythmia, unspecified: I49.9

## 2015-09-04 HISTORY — DX: Anemia, unspecified: D64.9

## 2015-09-04 HISTORY — PX: PARTIAL MASTECTOMY WITH NEEDLE LOCALIZATION: SHX6008

## 2015-09-04 IMAGING — MG US GUIDANCE NEEDLE PLACEMENT
1 series · 1 of 1 positions shown · non-contrast
Comparison: Previous exams.

CLINICAL DATA: Patient presents for needle localization prior to
surgical excision of a biopsy-proven malignancy over the 1 o'clock
position of the left breast 6 cm from the nipple.

EXAM:
NEEDLE LOCALIZATION OF THE LEFT BREAST WITH ULTRASOUND GUIDANCE

[L CC]
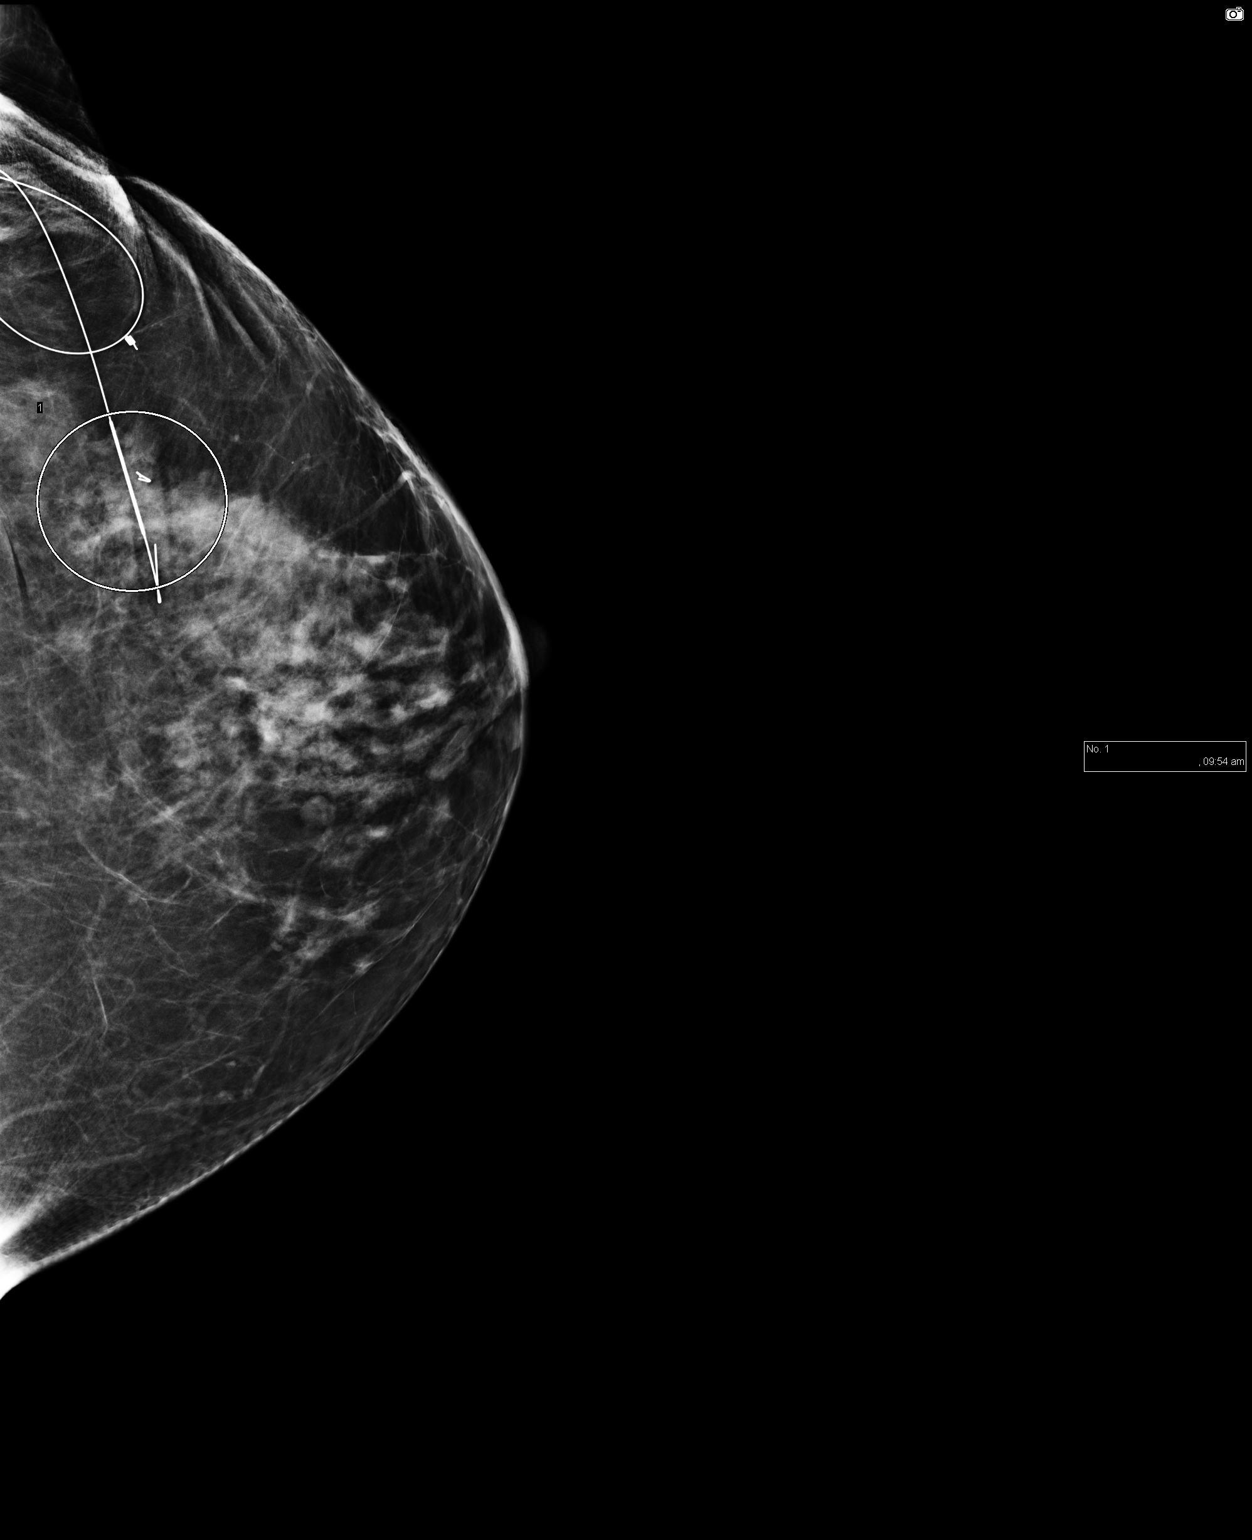

[1 of 1 positions shown; findings below may reference images not displayed]

FINDINGS: Patient presents for needle localization prior to surgical excision.
I met with the patient and we discussed the procedure of needle
localization including benefits and alternatives. We discussed the
high likelihood of a successful procedure. We discussed the risks of
the procedure, including infection, bleeding, tissue injury, and
further surgery. Informed, written consent was given. The usual
time-out protocol was performed immediately prior to the procedure.

Using ultrasound guidance, sterile technique, 1% lidocaine and a 7
cm modified Kopans needle, the targeted mass with clip was localized
using sonographic approach from lateral to medial. The wire passes
directly through the mass with hook just distal to the mass. A
cutaneous "X" was placed directly over the targeted mass.

Post placement mammographic image was obtained in the CC projection.
The targeted mass/clip lies adjacent the reinforced segment of the
wire. The images were marked for Dr. Malesaj.
IMPRESSION: Needle localization left breast. No apparent complications.

## 2015-09-04 SURGERY — PARTIAL MASTECTOMY WITH NEEDLE LOCALIZATION
Anesthesia: General | Laterality: Left | Wound class: Clean

## 2015-09-04 MED ORDER — FAMOTIDINE 20 MG PO TABS
ORAL_TABLET | ORAL | Status: AC
  Administered 2015-09-04: 20 mg via ORAL
  Filled 2015-09-04: qty 1

## 2015-09-04 MED ORDER — TECHNETIUM TC 99M SULFUR COLLOID
1.0000 | Freq: Once | INTRAVENOUS | Status: AC | PRN
  Administered 2015-09-04: 1.038 via INTRAVENOUS

## 2015-09-04 MED ORDER — FAMOTIDINE 20 MG PO TABS
20.0000 mg | ORAL_TABLET | Freq: Once | ORAL | Status: AC
  Administered 2015-09-04: 20 mg via ORAL

## 2015-09-04 MED ORDER — EPHEDRINE SULFATE 50 MG/ML IJ SOLN
INTRAMUSCULAR | Status: DC | PRN
  Administered 2015-09-04: 10 mg via INTRAVENOUS

## 2015-09-04 MED ORDER — DEXAMETHASONE SODIUM PHOSPHATE 10 MG/ML IJ SOLN
INTRAMUSCULAR | Status: DC | PRN
  Administered 2015-09-04: 5 mg via INTRAVENOUS

## 2015-09-04 MED ORDER — BUPIVACAINE-EPINEPHRINE 0.5% -1:200000 IJ SOLN
INTRAMUSCULAR | Status: DC | PRN
  Administered 2015-09-04: 9 mL

## 2015-09-04 MED ORDER — TRAMADOL HCL 50 MG PO TABS: 50.0000 mg | ORAL_TABLET | ORAL | Status: DC | PRN

## 2015-09-04 MED ORDER — LACTATED RINGERS IV SOLN
INTRAVENOUS | Status: DC
  Administered 2015-09-04: 11:00:00 via INTRAVENOUS

## 2015-09-04 MED ORDER — PHENYLEPHRINE HCL 10 MG/ML IJ SOLN
INTRAMUSCULAR | Status: DC | PRN
  Administered 2015-09-04: 100 ug via INTRAVENOUS

## 2015-09-04 MED ORDER — FENTANYL CITRATE (PF) 100 MCG/2ML IJ SOLN
INTRAMUSCULAR | Status: DC | PRN
  Administered 2015-09-04: 25 ug via INTRAVENOUS
  Administered 2015-09-04: 50 ug via INTRAVENOUS
  Administered 2015-09-04: 100 ug via INTRAVENOUS

## 2015-09-04 MED ORDER — TRAMADOL HCL 50 MG PO TABS
50.0000 mg | ORAL_TABLET | ORAL | Status: DC | PRN
  Administered 2015-09-04: 50 mg via ORAL
  Filled 2015-09-04: qty 1

## 2015-09-04 MED ORDER — ONDANSETRON HCL 4 MG/2ML IJ SOLN
INTRAMUSCULAR | Status: DC | PRN
  Administered 2015-09-04: 4 mg via INTRAVENOUS

## 2015-09-04 MED ORDER — ONDANSETRON HCL 4 MG/2ML IJ SOLN: 4.0000 mg | Freq: Once | INTRAMUSCULAR | Status: DC | PRN

## 2015-09-04 MED ORDER — TRAMADOL HCL 50 MG PO TABS
ORAL_TABLET | ORAL | Status: AC
  Administered 2015-09-04: 50 mg via ORAL
  Filled 2015-09-04: qty 1

## 2015-09-04 MED ORDER — FENTANYL CITRATE (PF) 100 MCG/2ML IJ SOLN
INTRAMUSCULAR | Status: AC
  Administered 2015-09-04: 25 ug via INTRAVENOUS
  Filled 2015-09-04: qty 2

## 2015-09-04 MED ORDER — MIDAZOLAM HCL 2 MG/2ML IJ SOLN
INTRAMUSCULAR | Status: DC | PRN
  Administered 2015-09-04: 2 mg via INTRAVENOUS

## 2015-09-04 MED ORDER — FENTANYL CITRATE (PF) 100 MCG/2ML IJ SOLN
25.0000 ug | INTRAMUSCULAR | Status: AC | PRN
  Administered 2015-09-04 (×6): 25 ug via INTRAVENOUS

## 2015-09-04 MED ORDER — PROPOFOL 10 MG/ML IV BOLUS
INTRAVENOUS | Status: DC | PRN
  Administered 2015-09-04: 30 mg via INTRAVENOUS
  Administered 2015-09-04: 160 mg via INTRAVENOUS

## 2015-09-04 MED ORDER — LIDOCAINE HCL (CARDIAC) 20 MG/ML IV SOLN
INTRAVENOUS | Status: DC | PRN
  Administered 2015-09-04: 100 mg via INTRAVENOUS

## 2015-09-04 MED ORDER — BUPIVACAINE-EPINEPHRINE (PF) 0.5% -1:200000 IJ SOLN
INTRAMUSCULAR | Status: AC
  Filled 2015-09-04: qty 30

## 2015-09-04 SURGICAL SUPPLY — 33 items
BLADE SURG 15 STRL LF DISP TIS (BLADE) ×1 IMPLANT
BLADE SURG 15 STRL SS (BLADE) ×2
CANISTER SUCT 1200ML W/VALVE (MISCELLANEOUS) ×3 IMPLANT
CHLORAPREP W/TINT 26ML (MISCELLANEOUS) ×3 IMPLANT
CNTNR SPEC 2.5X3XGRAD LEK (MISCELLANEOUS) ×1
CONT SPEC 4OZ STER OR WHT (MISCELLANEOUS) ×2
CONTAINER SPEC 2.5X3XGRAD LEK (MISCELLANEOUS) ×1 IMPLANT
DEVICE DUBIN SPECIMEN MAMMOGRA (MISCELLANEOUS) ×3 IMPLANT
DRAPE LAPAROTOMY 77X122 PED (DRAPES) ×3 IMPLANT
DRAPE SHEET LG 3/4 BI-LAMINATE (DRAPES) ×3 IMPLANT
ELECT REM PT RETURN 9FT ADLT (ELECTROSURGICAL) ×3
ELECTRODE REM PT RTRN 9FT ADLT (ELECTROSURGICAL) ×1 IMPLANT
GLOVE BIO SURGEON STRL SZ7.5 (GLOVE) ×9 IMPLANT
GLOVE EXAM NITRILE PF MED BLUE (GLOVE) ×6 IMPLANT
GOWN STRL REUS W/ TWL LRG LVL3 (GOWN DISPOSABLE) ×2 IMPLANT
GOWN STRL REUS W/TWL LRG LVL3 (GOWN DISPOSABLE) ×4
KIT RM TURNOVER STRD PROC AR (KITS) ×3 IMPLANT
LABEL OR SOLS (LABEL) ×3 IMPLANT
LIQUID BAND (GAUZE/BANDAGES/DRESSINGS) ×3 IMPLANT
MARGIN MAP 10MM (MISCELLANEOUS) ×3 IMPLANT
NDL SAFETY 18GX1.5 (NEEDLE) ×3 IMPLANT
NDL SAFETY 22GX1.5 (NEEDLE) ×3 IMPLANT
NEEDLE HYPO 25X1 1.5 SAFETY (NEEDLE) ×3 IMPLANT
PACK BASIN MINOR ARMC (MISCELLANEOUS) ×3 IMPLANT
SLEVE PROBE SENORX GAMMA FIND (MISCELLANEOUS) ×3 IMPLANT
SUT CHROMIC 4 0 RB 1X27 (SUTURE) ×3 IMPLANT
SUT ETHILON 3-0 FS-10 30 BLK (SUTURE) ×3
SUT MNCRL 4-0 (SUTURE) ×2
SUT MNCRL 4-0 27XMFL (SUTURE) ×1
SUTURE EHLN 3-0 FS-10 30 BLK (SUTURE) ×1 IMPLANT
SUTURE MNCRL 4-0 27XMF (SUTURE) ×1 IMPLANT
SYRINGE 10CC LL (SYRINGE) ×3 IMPLANT
WATER STERILE IRR 1000ML POUR (IV SOLUTION) ×3 IMPLANT

## 2015-09-04 NOTE — Discharge Instructions (Signed)
Take Tylenol and/or tramadol as needed for pain.  May shower.  May wear bra as desired for support and comfort.   AMBULATORY SURGERY  DISCHARGE INSTRUCTIONS   1) The drugs that you were given will stay in your system until tomorrow so for the next 24 hours you should not:  A) Drive an automobile B) Make any legal decisions C) Drink any alcoholic beverage   2) You may resume regular meals tomorrow.  Today it is better to start with liquids and gradually work up to solid foods.  You may eat anything you prefer, but it is better to start with liquids, then soup and crackers, and gradually work up to solid foods.   3) Please notify your doctor immediately if you have any unusual bleeding, trouble breathing, redness and pain at the surgery site, drainage, fever, or pain not relieved by medication.    4) Additional Instructions:        Please contact your physician with any problems or Same Day Surgery at [phone], Monday through Friday 6 am to 4 pm, or [place] at [facility] number at [phone].

## 2015-09-04 NOTE — Op Note (Signed)
OPERATIVE REPORT  PREOPERATIVE  DIAGNOSIS: . Left breast cancer  POSTOPERATIVE DIAGNOSIS: . Left breast cancer  PROCEDURE: . Left partial mastectomy, axillary sentinel lymph node biopsy, skin biopsy  ANESTHESIA:  General  SURGEON: Rochel Brome  MD   INDICATIONS: . She had recent findings of abnormality on mammogram upper outer quadrant of the left breast. Ultrasound demonstrated a 1.9 cm hypoechoic mass 1:00 position 6 cm from nipple. There was also a smaller hypoechoic mass 6 mm which was 7 cm from the nipple and approximately 1.1 cm from the larger mass. She had ultrasound-guided core biopsy demonstrated infiltrating mammary carcinoma in the larger mass.. She also has had a skin lesion of the areola of the upper outer quadrant and has had dermatology consultation and biopsy was recommended.  She did have preoperative injection of radioactive technetium sulfur colloid. She also had insertion of Kopan's wire. I reviewed her mammogram images with the Kopan's wire intact demonstrating the biopsy marker as well.  With the patient on the operating table in the supine position under general anesthesia the left arm was placed on a lateral arm support. The dressing was removed from the outer aspect of the left breast exposing the Kopan's wire which was cut 2 cm from the skin. The breast surrounding chest wall and upper arm were prepared with ChloraPrep and draped in a sterile manner.  A curvilinear incision was made from 12:00 to 3:00 position 6 cm from the nipple excising a 1.2 cm wide ellipse of skin which was kept in continuity with the underlying tissue. Dissection was carried down to encounter the wire and could then palpate a mass. A portion of breast tissue to include the mass and the site of the smaller mass was dissected free from surrounding tissues using electrocautery for hemostasis. The Kopan's wire was kept intact with the mass. The margin maps was used to suture markers to label the  cranial caudal medial and lateral margins. This was for the pathologist orientation. The specimen was submitted for specimen mammogram and the radiologist did call back to indicate that the biopsy marker was seen within the specimen. The tissue went on to the lab and the pathologist did called to report that both nodules were identified and margins appeared to be satisfactory. It is noted that the wound was inspected and several small bleeding points were cauterized  Attention was turned to the axilla which was probed with a gamma counter demonstrating location of radioactivity in the inferior aspect of the left axilla. An oblique incision was made in the inferior aspect of the axilla and carried down through subcutaneous tissues through superficial fascia using the gamma counter for direction and identified a location of the lymph node which was dissected free from surrounding structures with some surrounding fatty tissue. The ex vivo count was in the range of 600-650 counts per second. The background count was less than 20 counts per second. The lymph node was small in size. There was no remaining palpable mass within the axilla. The lymph node was submitted fresh for routine pathology.  The skin lesion of the areola of the upper outer quadrant was identified and was excised with an obliquely oriented elliptical excision which the incision was approximately 7 mm in width and was submitted for routine pathology in formalin. The wound was inspected and hemostasis was intact. All 3 wounds were infiltrated with half percent Sensorcaine with epinephrine. The subcutaneous tissues of the partial mastectomy wound were closed with interrupted 4-0 chromic. All 3  wounds were closed with 4-0 Monocryl subcuticular suture and LiquiBand  The patient tolerated surgery satisfactorily and appeared to be in satisfactory condition for transfer to the recovery room  [place].D.

## 2015-09-04 NOTE — H&P (Signed)
  She reports no change in condition since the day of the office examination.  She has had the insertion of Kopan's wire and injection of radioactive technetium sulfur colloid.  I discussed the plan for left partial mastectomy with axillary sentinel lymph node biopsy and biopsy of a skin lesion of the areola.  The left side was marked YES

## 2015-09-04 NOTE — Progress Notes (Signed)
Dr. Smith into see pt.

## 2015-09-04 NOTE — Anesthesia Preprocedure Evaluation (Signed)
Anesthesia Evaluation  Patient identified by MRN, date of birth, ID band Patient awake    Reviewed: Allergy & Precautions, NPO status , Patient's Chart, lab work & pertinent test results  History of Anesthesia Complications (+) Family history of anesthesia reactionNegative for: history of anesthetic complications (mother with PONV)  Airway Mallampati: II       Dental   Pulmonary former smoker,           Cardiovascular negative cardio ROS  + dysrhythmias (skip, no tx) (-) Valvular Problems/Murmurs     Neuro/Psych negative neurological ROS     GI/Hepatic negative GI ROS, (+) Hepatitis -, C, B  Endo/Other  negative endocrine ROS  Renal/GU negative Renal ROS     Musculoskeletal  (+) Arthritis , Osteoarthritis,    Abdominal   Peds  Hematology  (+) anemia ,   Anesthesia Other Findings   Reproductive/Obstetrics                             Anesthesia Physical Anesthesia Plan  ASA: II  Anesthesia Plan: General   Post-op Pain Management:    Induction: Intravenous  Airway Management Planned: LMA  Additional Equipment:   Intra-op Plan:   Post-operative Plan:   Informed Consent: I have reviewed the patients History and Physical, chart, labs and discussed the procedure including the risks, benefits and alternatives for the proposed anesthesia with the patient or authorized representative who has indicated his/her understanding and acceptance.     Plan Discussed with:   Anesthesia Plan Comments:         Anesthesia Quick Evaluation

## 2015-09-04 NOTE — Anesthesia Postprocedure Evaluation (Signed)
Anesthesia Post Note  Patient: Debra Joseph  Procedure(s) Performed: Procedure(s) (LRB): PARTIAL MASTECTOMY WITH PREOP XRAY NEEDLE LOCALIZATION (Left) MASTECTOMY WITH SENTINEL LYMPH NODE BIOPSY (Left) EXCISION OF SKIN LESION @ AREOLA LEFT BREAST (Left)  Patient location during evaluation: PACU Anesthesia Type: General Level of consciousness: awake and alert Pain management: pain level controlled Vital Signs Assessment: post-procedure vital signs reviewed and stable Respiratory status: spontaneous breathing and respiratory function stable Cardiovascular status: stable Anesthetic complications: no    Last Vitals:  Filed Vitals:   09/04/15 1338 09/04/15 1345  BP: 139/74   Pulse: 68 66  Temp:    Resp: 10 11    Last Pain:  Filed Vitals:   09/04/15 1349  PainSc: 7                  Jadiel Schmieder K

## 2015-09-04 NOTE — Transfer of Care (Signed)
Immediate Anesthesia Transfer of Care Note  Patient: Debra Joseph  Procedure(s) Performed: Procedure(s): PARTIAL MASTECTOMY WITH PREOP XRAY NEEDLE LOCALIZATION (Left) MASTECTOMY WITH SENTINEL LYMPH NODE BIOPSY (Left) EXCISION OF SKIN LESION @ AREOLA LEFT BREAST (Left)  Patient Location: PACU  Anesthesia Type:General  Level of Consciousness: awake, alert  and oriented  Airway & Oxygen Therapy: Patient Spontanous Breathing and Patient connected to face mask oxygen  Post-op Assessment: Report given to RN and Post -op Vital signs reviewed and stable  Post vital signs: Reviewed and stable  Last Vitals:  Filed Vitals:   09/04/15 1356 09/04/15 1402  BP: 136/77   Pulse: 62 62  Temp:    Resp: 11 15    Complications: No apparent anesthesia complications

## 2015-09-04 NOTE — Anesthesia Procedure Notes (Signed)
Procedure Name: LMA Insertion Date/Time: 09/04/2015 11:13 AM Performed by: [facility] Pre-anesthesia Checklist: Patient identified, Patient being monitored, Timeout performed, Emergency Drugs available and Suction available Patient Re-evaluated:Patient Re-evaluated prior to inductionOxygen Delivery Method: Circle system utilized Preoxygenation: Pre-oxygenation with 100% oxygen Intubation Type: IV induction Ventilation: Mask ventilation without difficulty LMA: LMA inserted LMA Size: 4.5 Tube type: Oral Number of attempts: 1 Placement Confirmation: positive ETCO2 Tube secured with: Tape Dental Injury: Teeth and Oropharynx as per pre-operative assessment

## 2015-09-04 NOTE — Progress Notes (Signed)
  Oncology Nurse Navigator Documentation  Navigator Location: [facility] (09/04/15 0900) Navigator Encounter Type: Other (surgery) (09/04/15 0900)       Surgery Date: 09/04/15 (09/04/15 0900) Treatment Initiated Date: 09/04/15 (09/04/15 0900) Patient Visit Type: Surgery (09/04/15 0900)   Barriers/Navigation Needs:  (support) (09/04/15 0900)                Acuity: Level 2 (09/04/15 0900)         Time Spent with Patient: 30 (09/04/15 0900)   Met patient today prior to her surgery.  She is doing well.  Offered support.  She is to call if she has any questions or needs.

## 2015-09-05 ENCOUNTER — Encounter: Payer: Self-pay | Admitting: *Deleted

## 2015-09-06 LAB — SURGICAL PATHOLOGY

## 2015-09-06 NOTE — Progress Notes (Signed)
Patient returned my call.  States she is doing well post surgery.  No needs at this time.

## 2015-09-06 NOTE — Progress Notes (Signed)
  Oncology Nurse Navigator Documentation  Navigator Location: [facility] (09/06/15 1300) Navigator Encounter Type: Telephone (09/06/15 1300)           Patient Visit Type: Other;Follow-up (09/06/15 1300)                    Acuity: Level 2 (09/06/15 1300)         Time Spent with Patient: 15 (09/06/15 1300)   Called patient to follow up with her.  She is one day post op.  Left message for her to call me.

## 2015-09-18 ENCOUNTER — Inpatient Hospital Stay: Payer: BC Managed Care – PPO | Attending: Oncology | Admitting: Oncology

## 2015-09-18 VITALS — BP 121/83 | HR 66 | Temp 97.8°F | Resp 16 | Wt 185.6 lb

## 2015-09-18 DIAGNOSIS — Z79899 Other long term (current) drug therapy: Secondary | ICD-10-CM

## 2015-09-18 DIAGNOSIS — Z8619 Personal history of other infectious and parasitic diseases: Secondary | ICD-10-CM | POA: Diagnosis not present

## 2015-09-18 DIAGNOSIS — Z87891 Personal history of nicotine dependence: Secondary | ICD-10-CM | POA: Diagnosis not present

## 2015-09-18 DIAGNOSIS — M129 Arthropathy, unspecified: Secondary | ICD-10-CM | POA: Insufficient documentation

## 2015-09-18 DIAGNOSIS — Z9012 Acquired absence of left breast and nipple: Secondary | ICD-10-CM | POA: Diagnosis not present

## 2015-09-18 DIAGNOSIS — Z85038 Personal history of other malignant neoplasm of large intestine: Secondary | ICD-10-CM

## 2015-09-18 DIAGNOSIS — C50412 Malignant neoplasm of upper-outer quadrant of left female breast: Secondary | ICD-10-CM | POA: Diagnosis present

## 2015-09-18 DIAGNOSIS — R011 Cardiac murmur, unspecified: Secondary | ICD-10-CM | POA: Insufficient documentation

## 2015-09-18 DIAGNOSIS — Z170 Estrogen receptor positive status [ER+]: Secondary | ICD-10-CM

## 2015-09-18 DIAGNOSIS — Z862 Personal history of diseases of the blood and blood-forming organs and certain disorders involving the immune mechanism: Secondary | ICD-10-CM | POA: Insufficient documentation

## 2015-09-18 NOTE — Progress Notes (Signed)
Patient is here to discuss further treatment after partial mastectomy of left breast.

## 2015-09-19 NOTE — Progress Notes (Signed)
[place]  Telephone:([phone]) [phone] Fax:(336) [phone]  ID: Debra Joseph OB: May 29, 1951  MR#: [id number]  KDX#:[id number]  Patient Care Team: Madelyn Brunner, MD as PCP - General (Internal Medicine)  CHIEF COMPLAINT:  Chief Complaint  Patient presents with  . Breast Cancer    INTERVAL HISTORY: Patient Returns to clinic today for further evaluation, discussion of her final pathology results and Oncotype score. She continues to have mild breast tenderness at the site of her surgery, but otherwise feels well. She has no neurologic complaints. She denies any recent fevers or illnesses. She denies any pain. She has no chest pain or shortness of breath. She denies any nausea, vomiting, constipation, or diarrhea. She has no urinary complaints. Patient otherwise feels well and offers no further specific complaints.  REVIEW OF SYSTEMS:   Review of Systems  Constitutional: Negative.  Negative for fever, weight loss and malaise/fatigue.  Respiratory: Negative.  Negative for sputum production.   Cardiovascular: Negative.  Negative for chest pain.  Gastrointestinal: Negative.   Genitourinary: Negative.   Musculoskeletal: Negative.   Neurological: Negative.  Negative for weakness.  Psychiatric/Behavioral: The patient is nervous/anxious.     As per HPI. Otherwise, a complete review of systems is negatve.  PAST MEDICAL HISTORY: Past Medical History  Diagnosis Date  . Cancer ([place]) 2005    COLON CA  . Colon cancer ([place])   . Arthritis   . Heart murmur   . Hepatitis B     AGE 38  . Hepatitis C 1999    NO CIRRHOSIS  . Anemia   . Family history of adverse reaction to anesthesia     MOM-NAUSEATED  . Seasonal allergies   . Dysrhythmia     PALPITATIONS    PAST SURGICAL HISTORY: Past Surgical History  Procedure Laterality Date  . Breast biopsy Left 08/09/2015    2 areas path pending  . Tubal ligation    . Colonoscopy      MULTIPLE  . Abdominal hysterectomy     pt states hemorrhage during hysterectomy but had never had any problems with prior surgery  . Partial mastectomy with needle localization Left 09/04/2015    Procedure: PARTIAL MASTECTOMY WITH PREOP XRAY NEEDLE LOCALIZATION;  Surgeon: Leonie Green, MD;  Location: [facility] ORS;  Service: General;  Laterality: Left;  Marland Kitchen Mastectomy w/ sentinel node biopsy Left 09/04/2015    Procedure: MASTECTOMY WITH SENTINEL LYMPH NODE BIOPSY;  Surgeon: Leonie Green, MD;  Location: [facility] ORS;  Service: General;  Laterality: Left;  . Excision of breast lesion Left 09/04/2015    Procedure: EXCISION OF SKIN LESION @ AREOLA LEFT BREAST;  Surgeon: Leonie Green, MD;  Location: [facility] ORS;  Service: General;  Laterality: Left;    FAMILY HISTORY Family History  Problem Relation Age of Onset  . Breast cancer Neg Hx        ADVANCED DIRECTIVES:    HEALTH MAINTENANCE: Social History  Substance Use Topics  . Smoking status: Former Smoker -- 0.50 packs/day for 35 years    Types: E-cigarettes  . Smokeless tobacco: Former Systems developer    Quit date: 08/19/2012     Comment: VAPOR CIG   . Alcohol Use: No     Colonoscopy:  PAP:  Bone density:  Lipid panel:  Allergies  Allergen Reactions  . Other Other (See Comments)    DARVOCET    Current Outpatient Prescriptions  Medication Sig Dispense Refill  . diphenhydrAMINE (BENADRYL) 25 MG tablet Take 25 mg  by mouth every 6 (six) hours as needed.    . fluticasone (FLONASE) 50 MCG/ACT nasal spray Place 2 sprays into both nostrils as needed for allergies or rhinitis.    Marland Kitchen LORazepam (ATIVAN) 0.5 MG tablet Take 0.5 mg by mouth every 8 (eight) hours as needed.     . Multiple Vitamins-Minerals (CENTRUM ULTRA WOMENS PO) Take 1 tablet by mouth daily.     . naproxen sodium (RA NAPROXEN SODIUM) 220 MG tablet Take by mouth.    . traMADol (ULTRAM) 50 MG tablet Take 1 tablet (50 mg total) by mouth every 4 (four) hours as needed for moderate pain. 20 tablet 0   No current  facility-administered medications for this visit.    OBJECTIVE: Filed Vitals:   09/18/15 0948  BP: 121/83  Pulse: 66  Temp: 97.8 F (36.6 C)  Resp: 16     Body mass index is 32.89 kg/(m^2).    ECOG FS:0 - Asymptomatic  General: Well-developed, well-nourished, no acute distress. Eyes: Pink conjunctiva, anicteric sclera. Breasts: Well healing surgical scar on left breast. Lungs: Clear to auscultation bilaterally. Heart: Regular rate and rhythm. No rubs, murmurs, or gallops. Abdomen: Soft, nontender, nondistended. No organomegaly noted, normoactive bowel sounds. Musculoskeletal: No edema, cyanosis, or clubbing. Neuro: Alert, answering all questions appropriately. Cranial nerves grossly intact. Skin: No rash or petechiae noted. Psych: Normal affect.    LAB RESULTS:  No results found for: NA, K, CL, CO2, GLUCOSE, BUN, CREATININE, CALCIUM, PROT, ALBUMIN, AST, ALT, ALKPHOS, BILITOT, GFRNONAA, GFRAA  No results found for: WBC, NEUTROABS, HGB, HCT, MCV, PLT   STUDIES: US Guided Needle Placement  09/04/2015  CLINICAL DATA:  Patient presents for needle localization prior to surgical excision of a biopsy-proven malignancy over the 1 o'clock position of the left breast 6 cm from the nipple. EXAM: NEEDLE LOCALIZATION OF THE LEFT BREAST WITH ULTRASOUND GUIDANCE COMPARISON:  Previous exams. FINDINGS: Patient presents for needle localization prior to surgical excision. I met with the patient and we discussed the procedure of needle localization including benefits and alternatives. We discussed the high likelihood of a successful procedure. We discussed the risks of the procedure, including infection, bleeding, tissue injury, and further surgery. Informed, written consent was given. The usual time-out protocol was performed immediately prior to the procedure. Using ultrasound guidance, sterile technique, 1% lidocaine and a 7 cm modified Kopans needle, the targeted mass with clip was localized using  sonographic approach from lateral to medial. The wire passes directly through the mass with hook just distal to the mass. A cutaneous "X" was placed directly over the targeted mass. Post placement mammographic image was obtained in the CC projection. The targeted mass/clip lies adjacent the reinforced segment of the wire. The images were marked for Dr. Tamala Julian. IMPRESSION: Needle localization left breast. No apparent complications. Electronically Signed   By: Marin Olp M.D.   On: 09/04/2015 09:55   Nm Sentinel Node Inj-no Rpt (breast)  09/04/2015  CLINICAL DATA: left breast ca Sulfur colloid was injected intradermally by the nuclear medicine technologist for breast cancer sentinel node localization.   Mm Breast Surgical Specimen  09/04/2015  CLINICAL DATA:  Patient is post needle localization subsequent surgical excision of biopsy proven malignancy over the upper-outer left breast. EXAM: SPECIMEN RADIOGRAPH OF THE LEFT BREAST COMPARISON:  Previous exam(s). FINDINGS: Status post excision of the left breast. The wire tip and biopsy marker clip are present and are marked for pathology. IMPRESSION: Specimen radiograph of the left breast. Electronically Signed  By: Marin Olp M.D.   On: 09/04/2015 12:13   Mm Digital Diagnostic Unilat L  09/04/2015  CLINICAL DATA:  Patient presents for needle localization prior to surgical excision of a biopsy-proven malignancy over the 1 o'clock position of the left breast 6 cm from the nipple. EXAM: NEEDLE LOCALIZATION OF THE LEFT BREAST WITH ULTRASOUND GUIDANCE COMPARISON:  Previous exams. FINDINGS: Patient presents for needle localization prior to surgical excision. I met with the patient and we discussed the procedure of needle localization including benefits and alternatives. We discussed the high likelihood of a successful procedure. We discussed the risks of the procedure, including infection, bleeding, tissue injury, and further surgery. Informed, written consent was  given. The usual time-out protocol was performed immediately prior to the procedure. Using ultrasound guidance, sterile technique, 1% lidocaine and a 7 cm modified Kopans needle, the targeted mass with clip was localized using sonographic approach from lateral to medial. The wire passes directly through the mass with hook just distal to the mass. A cutaneous "X" was placed directly over the targeted mass. Post placement mammographic image was obtained in the CC projection. The targeted mass/clip lies adjacent the reinforced segment of the wire. The images were marked for Dr. Tamala Julian. IMPRESSION: Needle localization left breast. No apparent complications. Electronically Signed   By: Marin Olp M.D.   On: 09/04/2015 09:55    ASSESSMENT: Pathologic stage Ia ER/PR positive, HER-2 negative adenocarcinoma of the left breast. Oncotype DX score 13 which is low risk. PLAN:    1. Breast cancer: Final pathology results as above. Patient also has a low risk Oncotype score therefore adjuvant chemotherapy is not necessary. She will have consultation with radiation oncology in the next 1-2 weeks to discuss adjuvant XRT.  Given the ER/PR status of her tumor, patient will also benefit from an aromatase inhibitor for 5 years at the conclusions of her treatments. Patient will return to clinic in approximately 3 months at the conclusion of her XRT for further evaluation and discussion of initiating an aromatase inhibitor. 2. Left breast skin lesion: Biopsy was nonmalignant.  Approximately 30 minutes was spent in discussion of which greater than 50% was consultation.  Patient expressed understanding and was in agreement with this plan. She also understands that She can call clinic at any time with any questions, concerns, or complaints.   Breast cancer, left [facility])   Staging form: Breast, AJCC 7th Edition     Clinical stage from 09/02/2015: Stage IA (T1c, N0, M0) - Signed by Lloyd Huger, MD on 09/02/2015   Lloyd Huger, MD   09/19/2015 11:54 AM

## 2015-10-04 ENCOUNTER — Ambulatory Visit
Admission: RE | Admit: 2015-10-04 | Discharge: 2015-10-04 | Disposition: A | Discharge status: Home / Self Care | Payer: BC Managed Care – PPO | Source: Ambulatory Visit | Attending: Radiation Oncology | Admitting: Radiation Oncology

## 2015-10-04 ENCOUNTER — Encounter: Payer: Self-pay | Admitting: Radiation Oncology

## 2015-10-04 VITALS — BP 136/86 | HR 67 | Temp 96.5°F | Resp 20 | Wt 186.8 lb

## 2015-10-04 DIAGNOSIS — C50412 Malignant neoplasm of upper-outer quadrant of left female breast: Secondary | ICD-10-CM

## 2015-10-04 DIAGNOSIS — B1920 Unspecified viral hepatitis C without hepatic coma: Secondary | ICD-10-CM | POA: Insufficient documentation

## 2015-10-04 DIAGNOSIS — M129 Arthropathy, unspecified: Secondary | ICD-10-CM | POA: Insufficient documentation

## 2015-10-04 DIAGNOSIS — Z85038 Personal history of other malignant neoplasm of large intestine: Secondary | ICD-10-CM | POA: Insufficient documentation

## 2015-10-04 DIAGNOSIS — D649 Anemia, unspecified: Secondary | ICD-10-CM | POA: Insufficient documentation

## 2015-10-04 DIAGNOSIS — B1910 Unspecified viral hepatitis B without hepatic coma: Secondary | ICD-10-CM | POA: Insufficient documentation

## 2015-10-04 DIAGNOSIS — Z87891 Personal history of nicotine dependence: Secondary | ICD-10-CM | POA: Insufficient documentation

## 2015-10-04 DIAGNOSIS — Z79899 Other long term (current) drug therapy: Secondary | ICD-10-CM | POA: Insufficient documentation

## 2015-10-04 DIAGNOSIS — R011 Cardiac murmur, unspecified: Secondary | ICD-10-CM | POA: Insufficient documentation

## 2015-10-04 DIAGNOSIS — Z170 Estrogen receptor positive status [ER+]: Secondary | ICD-10-CM | POA: Insufficient documentation

## 2015-10-04 DIAGNOSIS — Z510 Encounter for antineoplastic radiation therapy: Secondary | ICD-10-CM | POA: Insufficient documentation

## 2015-10-04 NOTE — Progress Notes (Signed)
Verbal and written education about radiation therapy given to patient and husband.  All questions answered to their satisfaction.   Approximately 10 minutes spent on education.

## 2015-10-04 NOTE — Consult Note (Signed)
Except an outstanding is perfect of Radiation Oncology NEW PATIENT EVALUATION  Name: Debra Joseph  MRN: [id number]  Date:   10/04/2015     DOB: July 06, 1950   This 65 y.o. female patient presents to the clinic for initial evaluation of stage I invasive mammary carcinoma (T1 CN 0 M0) ER/PR positive HER-2/neu not overexpressed with low recurrence risk on Oncotype DX for whole breast radiation to her left breast.  REFERRING PHYSICIAN: Madelyn Brunner, MD  CHIEF COMPLAINT:  Chief Complaint  Patient presents with  . Breast Cancer    Pt is here for initial consultation of breast cancer.     DIAGNOSIS: The encounter diagnosis was Malignant neoplasm of upper-outer quadrant of left female breast ([place]).   PREVIOUS INVESTIGATIONS:  Mammograms and ultrasound reviewed Clinical notes reviewed Surgical pathology report reviewed  HPI: Patient is a 65 year old female who presented with an abnormal mammogram showing abnormal calcifications in the left breast in the upper outer quadrant confirmed on left breast, symphysis. This was confirmed on ultrasound. She underwent biopsy which was positive for invasive mammary carcinoma. She then underwent wide local excision and sentinel node biopsy. Tumor was 1.6 cm ER/PR positive HER-2/neu not overexpressed. One sentinel lymph node was negative. She also has skin lesion near her left areole to which was excised showing to be eczematous dermatitis. Margins were clear tumor was overall grade 2. Patient underwent Oncotype DX which showed a score of 13 which is low risk for recurrence. She is now referred to radiation oncology for consideration of whole breast radiation. She still quite sore which she's been for the past month she has apparent hematoma in her lumpectomy site. She also still has some bruising of the left breast which is swollen but does not show any any evidence of mastitis.  PLANNED TREATMENT REGIMEN: Hypofractionated course of radiation therapy  PAST  MEDICAL HISTORY:  has a past medical history of Cancer ([place]) (2005); Colon cancer ([place]); Arthritis; Heart murmur; Hepatitis B; Hepatitis C (1999); Anemia; Family history of adverse reaction to anesthesia; Seasonal allergies; and Dysrhythmia.    PAST SURGICAL HISTORY:  Past Surgical History  Procedure Laterality Date  . Breast biopsy Left 08/09/2015    2 areas path pending  . Tubal ligation    . Colonoscopy      MULTIPLE  . Abdominal hysterectomy      pt states hemorrhage during hysterectomy but had never had any problems with prior surgery  . Partial mastectomy with needle localization Left 09/04/2015    Procedure: PARTIAL MASTECTOMY WITH PREOP XRAY NEEDLE LOCALIZATION;  Surgeon: Leonie Green, MD;  Location: [facility] ORS;  Service: General;  Laterality: Left;  Marland Kitchen Mastectomy w/ sentinel node biopsy Left 09/04/2015    Procedure: MASTECTOMY WITH SENTINEL LYMPH NODE BIOPSY;  Surgeon: Leonie Green, MD;  Location: [facility] ORS;  Service: General;  Laterality: Left;  . Excision of breast lesion Left 09/04/2015    Procedure: EXCISION OF SKIN LESION @ AREOLA LEFT BREAST;  Surgeon: Leonie Green, MD;  Location: [facility] ORS;  Service: General;  Laterality: Left;    FAMILY HISTORY: family history is negative for Breast cancer.  SOCIAL HISTORY:  reports that she has quit smoking. Her smoking use included E-cigarettes. She has a 17.5 pack-year smoking history. She quit smokeless tobacco use about 3 years ago. She reports that she does not drink alcohol or use illicit drugs.  ALLERGIES: Other  MEDICATIONS:  Current Outpatient Prescriptions  Medication Sig Dispense Refill  . diphenhydrAMINE (  BENADRYL) 25 MG tablet Take 25 mg by mouth every 6 (six) hours as needed.    . fluticasone (FLONASE) 50 MCG/ACT nasal spray Place 2 sprays into both nostrils as needed for allergies or rhinitis.    Marland Kitchen LORazepam (ATIVAN) 0.5 MG tablet Take 0.5 mg by mouth every 8 (eight) hours as needed.     . Multiple  Vitamins-Minerals (CENTRUM ULTRA WOMENS PO) Take 1 tablet by mouth daily.     . naproxen sodium (RA NAPROXEN SODIUM) 220 MG tablet Take by mouth.    . traMADol (ULTRAM) 50 MG tablet Take 1 tablet (50 mg total) by mouth every 4 (four) hours as needed for moderate pain. 20 tablet 0   No current facility-administered medications for this encounter.    ECOG PERFORMANCE STATUS:  0 - Asymptomatic  REVIEW OF SYSTEMS:  Patient denies any weight loss, fatigue, weakness, fever, chills or night sweats. Patient denies any loss of vision, blurred vision. Patient denies any ringing  of the ears or hearing loss. No irregular heartbeat. Patient denies heart murmur or history of fainting. Patient denies any chest pain or pain radiating to her upper extremities. Patient denies any shortness of breath, difficulty breathing at night, cough or hemoptysis. Patient denies any swelling in the lower legs. Patient denies any nausea vomiting, vomiting of blood, or coffee ground material in the vomitus. Patient denies any stomach pain. Patient states has had normal bowel movements no significant constipation or diarrhea. Patient denies any dysuria, hematuria or significant nocturia. Patient denies any problems walking, swelling in the joints or loss of balance. Patient denies any skin changes, loss of hair or loss of weight. Patient denies any excessive worrying or anxiety or significant depression. Patient denies any problems with insomnia. Patient denies excessive thirst, polyuria, polydipsia. Patient denies any swollen glands, patient denies easy bruising or easy bleeding. Patient denies any recent infections, allergies or URI. Patient "s visual fields have not changed significantly in recent time.    PHYSICAL EXAM: BP 136/86 mmHg  Pulse 67  Temp(Src) 96.5 F (35.8 C)  Resp 20  Wt 186 lb 13.4 oz (84.75 kg) Well-developed female in NAD. Left breast is wide local excision scar which is healing well she's has some  ecchymosis of the skin and apparent hematoma in the lumpectomy site causing some swelling. No other dominant mass or nodularity is noted in either breast in 2 positions examined. No axillary or supraclavicular adenopathy is appreciated. Well-developed well-nourished patient in NAD. HEENT reveals PERLA, EOMI, discs not visualized.  Oral cavity is clear. No oral mucosal lesions are identified. Neck is clear without evidence of cervical or supraclavicular adenopathy. Lungs are clear to A&P. Cardiac examination is essentially unremarkable with regular rate and rhythm without murmur rub or thrill. Abdomen is benign with no organomegaly or masses noted. Motor sensory and DTR levels are equal and symmetric in the upper and lower extremities. Cranial nerves II through XII are grossly intact. Proprioception is intact. No peripheral adenopathy or edema is identified. No motor or sensory levels are noted. Crude visual fields are within normal range.  LABORATORY DATA: Pathology reports reviewed    RADIOLOGY RESULTS: Ultrasound and mammograms reviewed   IMPRESSION: Stage I invasive mammary carcinoma ER/PR positive HER-2/neu negative low Oncotype DX recurrence risk score status post wide local excision and sentinel node biopsy of the left breast in 65 year old female  PLAN: At this time I would recommend a hypofractionated course of radiation therapy over 4 weeks to her left breast. Would  also boost her scar another 1400 cGy using electron beam. Risks and benefits of treatment including skin reaction fatigue alteration of blood counts possible inclusion of some superficial lung all were discussed in detail with the patient and her friend. I've assured we can continue with radiation therapy treatment planning at this time despite the swelling of her breast which should subside over the next several weeks. Should this problem. Persist may refer her back to surgeon for possible and needle drainage of some of the fluid in  her cavity. Patient also will be candidate for aromatase inhibitor therapy after completion of radiation. I personally set up and ordered CT simulation for next week.  I would like to take this opportunity for allowing me to participate in the care of your patient.Armstead Peaks., MD

## 2015-10-09 ENCOUNTER — Ambulatory Visit
Admission: RE | Admit: 2015-10-09 | Discharge: 2015-10-09 | Disposition: A | Discharge status: Home / Self Care | Payer: BC Managed Care – PPO | Source: Ambulatory Visit | Attending: Radiation Oncology | Admitting: Radiation Oncology

## 2015-10-09 DIAGNOSIS — M129 Arthropathy, unspecified: Secondary | ICD-10-CM | POA: Diagnosis not present

## 2015-10-09 DIAGNOSIS — Z87891 Personal history of nicotine dependence: Secondary | ICD-10-CM | POA: Diagnosis not present

## 2015-10-09 DIAGNOSIS — Z170 Estrogen receptor positive status [ER+]: Secondary | ICD-10-CM | POA: Diagnosis not present

## 2015-10-09 DIAGNOSIS — R011 Cardiac murmur, unspecified: Secondary | ICD-10-CM | POA: Diagnosis not present

## 2015-10-09 DIAGNOSIS — D649 Anemia, unspecified: Secondary | ICD-10-CM | POA: Diagnosis not present

## 2015-10-09 DIAGNOSIS — Z79899 Other long term (current) drug therapy: Secondary | ICD-10-CM | POA: Diagnosis not present

## 2015-10-09 DIAGNOSIS — Z85038 Personal history of other malignant neoplasm of large intestine: Secondary | ICD-10-CM | POA: Diagnosis not present

## 2015-10-09 DIAGNOSIS — B1920 Unspecified viral hepatitis C without hepatic coma: Secondary | ICD-10-CM | POA: Diagnosis not present

## 2015-10-09 DIAGNOSIS — C50412 Malignant neoplasm of upper-outer quadrant of left female breast: Secondary | ICD-10-CM | POA: Diagnosis present

## 2015-10-09 DIAGNOSIS — B1910 Unspecified viral hepatitis B without hepatic coma: Secondary | ICD-10-CM | POA: Diagnosis not present

## 2015-10-09 DIAGNOSIS — Z510 Encounter for antineoplastic radiation therapy: Secondary | ICD-10-CM | POA: Diagnosis not present

## 2015-10-10 ENCOUNTER — Encounter: Payer: Self-pay | Admitting: Oncology

## 2015-10-10 DIAGNOSIS — C50412 Malignant neoplasm of upper-outer quadrant of left female breast: Secondary | ICD-10-CM | POA: Diagnosis not present

## 2015-10-12 ENCOUNTER — Other Ambulatory Visit: Payer: Self-pay | Admitting: *Deleted

## 2015-10-12 DIAGNOSIS — C50412 Malignant neoplasm of upper-outer quadrant of left female breast: Secondary | ICD-10-CM

## 2015-10-16 ENCOUNTER — Ambulatory Visit
Admission: RE | Admit: 2015-10-16 | Discharge: 2015-10-16 | Disposition: A | Discharge status: Home / Self Care | Payer: BC Managed Care – PPO | Source: Ambulatory Visit | Attending: Radiation Oncology | Admitting: Radiation Oncology

## 2015-10-16 DIAGNOSIS — C50412 Malignant neoplasm of upper-outer quadrant of left female breast: Secondary | ICD-10-CM | POA: Diagnosis not present

## 2015-10-17 ENCOUNTER — Ambulatory Visit
Admission: RE | Admit: 2015-10-17 | Discharge: 2015-10-17 | Disposition: A | Discharge status: Home / Self Care | Payer: BC Managed Care – PPO | Source: Ambulatory Visit | Attending: Radiation Oncology | Admitting: Radiation Oncology

## 2015-10-17 DIAGNOSIS — C50412 Malignant neoplasm of upper-outer quadrant of left female breast: Secondary | ICD-10-CM | POA: Diagnosis not present

## 2015-10-18 ENCOUNTER — Ambulatory Visit
Admission: RE | Admit: 2015-10-18 | Discharge: 2015-10-18 | Disposition: A | Discharge status: Home / Self Care | Payer: BC Managed Care – PPO | Source: Ambulatory Visit | Attending: Radiation Oncology | Admitting: Radiation Oncology

## 2015-10-18 DIAGNOSIS — C50412 Malignant neoplasm of upper-outer quadrant of left female breast: Secondary | ICD-10-CM | POA: Diagnosis not present

## 2015-10-19 ENCOUNTER — Ambulatory Visit
Admission: RE | Admit: 2015-10-19 | Discharge: 2015-10-19 | Disposition: A | Discharge status: Home / Self Care | Payer: BC Managed Care – PPO | Source: Ambulatory Visit | Attending: Radiation Oncology | Admitting: Radiation Oncology

## 2015-10-19 DIAGNOSIS — C50412 Malignant neoplasm of upper-outer quadrant of left female breast: Secondary | ICD-10-CM | POA: Diagnosis not present

## 2015-10-22 ENCOUNTER — Ambulatory Visit
Admission: RE | Admit: 2015-10-22 | Discharge: 2015-10-22 | Disposition: A | Discharge status: Home / Self Care | Payer: BC Managed Care – PPO | Source: Ambulatory Visit | Attending: Radiation Oncology | Admitting: Radiation Oncology

## 2015-10-22 DIAGNOSIS — C50412 Malignant neoplasm of upper-outer quadrant of left female breast: Secondary | ICD-10-CM | POA: Diagnosis not present

## 2015-10-23 ENCOUNTER — Ambulatory Visit
Admission: RE | Admit: 2015-10-23 | Discharge: 2015-10-23 | Disposition: A | Discharge status: Home / Self Care | Payer: BC Managed Care – PPO | Source: Ambulatory Visit | Attending: Radiation Oncology | Admitting: Radiation Oncology

## 2015-10-23 DIAGNOSIS — C50412 Malignant neoplasm of upper-outer quadrant of left female breast: Secondary | ICD-10-CM | POA: Diagnosis not present

## 2015-10-24 ENCOUNTER — Ambulatory Visit
Admission: RE | Admit: 2015-10-24 | Discharge: 2015-10-24 | Disposition: A | Discharge status: Home / Self Care | Payer: BC Managed Care – PPO | Source: Ambulatory Visit | Attending: Radiation Oncology | Admitting: Radiation Oncology

## 2015-10-24 DIAGNOSIS — C50412 Malignant neoplasm of upper-outer quadrant of left female breast: Secondary | ICD-10-CM | POA: Diagnosis not present

## 2015-10-25 ENCOUNTER — Ambulatory Visit
Admission: RE | Admit: 2015-10-25 | Discharge: 2015-10-25 | Disposition: A | Discharge status: Home / Self Care | Payer: BC Managed Care – PPO | Source: Ambulatory Visit | Attending: Radiation Oncology | Admitting: Radiation Oncology

## 2015-10-25 DIAGNOSIS — C50412 Malignant neoplasm of upper-outer quadrant of left female breast: Secondary | ICD-10-CM | POA: Diagnosis not present

## 2015-10-26 ENCOUNTER — Ambulatory Visit
Admission: RE | Admit: 2015-10-26 | Discharge: 2015-10-26 | Disposition: A | Discharge status: Home / Self Care | Payer: BC Managed Care – PPO | Source: Ambulatory Visit | Attending: Radiation Oncology | Admitting: Radiation Oncology

## 2015-10-26 ENCOUNTER — Ambulatory Visit: Payer: BC Managed Care – PPO

## 2015-10-26 DIAGNOSIS — C50412 Malignant neoplasm of upper-outer quadrant of left female breast: Secondary | ICD-10-CM | POA: Diagnosis not present

## 2015-10-30 ENCOUNTER — Ambulatory Visit
Admission: RE | Admit: 2015-10-30 | Discharge: 2015-10-30 | Disposition: A | Discharge status: Home / Self Care | Payer: BC Managed Care – PPO | Source: Ambulatory Visit | Attending: Radiation Oncology | Admitting: Radiation Oncology

## 2015-10-30 DIAGNOSIS — C50412 Malignant neoplasm of upper-outer quadrant of left female breast: Secondary | ICD-10-CM | POA: Diagnosis not present

## 2015-10-31 ENCOUNTER — Ambulatory Visit
Admission: RE | Admit: 2015-10-31 | Discharge: 2015-10-31 | Disposition: A | Discharge status: Home / Self Care | Payer: BC Managed Care – PPO | Source: Ambulatory Visit | Attending: Radiation Oncology | Admitting: Radiation Oncology

## 2015-10-31 ENCOUNTER — Inpatient Hospital Stay: Payer: BC Managed Care – PPO | Attending: Radiation Oncology

## 2015-10-31 DIAGNOSIS — C50412 Malignant neoplasm of upper-outer quadrant of left female breast: Secondary | ICD-10-CM

## 2015-10-31 DIAGNOSIS — Z170 Estrogen receptor positive status [ER+]: Secondary | ICD-10-CM | POA: Diagnosis not present

## 2015-10-31 LAB — CBC
HCT: 41.7 % (ref 35.0–47.0)
Hemoglobin: 14.4 g/dL (ref 12.0–16.0)
MCH: 31.8 pg (ref 26.0–34.0)
MCHC: 34.5 g/dL (ref 32.0–36.0)
MCV: 92.3 fL (ref 80.0–100.0)
PLATELETS: 270 10*3/uL (ref 150–440)
RBC: 4.52 MIL/uL (ref 3.80–5.20)
RDW: 13.1 % (ref 11.5–14.5)
WBC: 8 10*3/uL (ref 3.6–11.0)

## 2015-11-01 ENCOUNTER — Ambulatory Visit
Admission: RE | Admit: 2015-11-01 | Discharge: 2015-11-01 | Disposition: A | Discharge status: Home / Self Care | Payer: BC Managed Care – PPO | Source: Ambulatory Visit | Attending: Radiation Oncology | Admitting: Radiation Oncology

## 2015-11-01 DIAGNOSIS — C50412 Malignant neoplasm of upper-outer quadrant of left female breast: Secondary | ICD-10-CM | POA: Diagnosis not present

## 2015-11-02 ENCOUNTER — Ambulatory Visit
Admission: RE | Admit: 2015-11-02 | Discharge: 2015-11-02 | Disposition: A | Discharge status: Home / Self Care | Payer: BC Managed Care – PPO | Source: Ambulatory Visit | Attending: Radiation Oncology | Admitting: Radiation Oncology

## 2015-11-02 DIAGNOSIS — C50412 Malignant neoplasm of upper-outer quadrant of left female breast: Secondary | ICD-10-CM | POA: Diagnosis not present

## 2015-11-05 ENCOUNTER — Ambulatory Visit
Admission: RE | Admit: 2015-11-05 | Discharge: 2015-11-05 | Disposition: A | Discharge status: Home / Self Care | Payer: BC Managed Care – PPO | Source: Ambulatory Visit | Attending: Radiation Oncology | Admitting: Radiation Oncology

## 2015-11-05 DIAGNOSIS — C50412 Malignant neoplasm of upper-outer quadrant of left female breast: Secondary | ICD-10-CM | POA: Diagnosis not present

## 2015-11-06 ENCOUNTER — Ambulatory Visit
Admission: RE | Admit: 2015-11-06 | Discharge: 2015-11-06 | Disposition: A | Discharge status: Home / Self Care | Payer: BC Managed Care – PPO | Source: Ambulatory Visit | Attending: Radiation Oncology | Admitting: Radiation Oncology

## 2015-11-06 DIAGNOSIS — C50412 Malignant neoplasm of upper-outer quadrant of left female breast: Secondary | ICD-10-CM | POA: Diagnosis not present

## 2015-11-07 ENCOUNTER — Ambulatory Visit
Admission: RE | Admit: 2015-11-07 | Discharge: 2015-11-07 | Disposition: A | Discharge status: Home / Self Care | Payer: BC Managed Care – PPO | Source: Ambulatory Visit | Attending: Radiation Oncology | Admitting: Radiation Oncology

## 2015-11-07 DIAGNOSIS — C50412 Malignant neoplasm of upper-outer quadrant of left female breast: Secondary | ICD-10-CM | POA: Diagnosis not present

## 2015-11-08 ENCOUNTER — Ambulatory Visit
Admission: RE | Admit: 2015-11-08 | Discharge: 2015-11-08 | Disposition: A | Discharge status: Home / Self Care | Payer: BC Managed Care – PPO | Source: Ambulatory Visit | Attending: Radiation Oncology | Admitting: Radiation Oncology

## 2015-11-08 DIAGNOSIS — C50412 Malignant neoplasm of upper-outer quadrant of left female breast: Secondary | ICD-10-CM | POA: Diagnosis not present

## 2015-11-09 ENCOUNTER — Ambulatory Visit: Payer: BC Managed Care – PPO

## 2015-11-09 ENCOUNTER — Ambulatory Visit
Admission: RE | Admit: 2015-11-09 | Discharge: 2015-11-09 | Disposition: A | Discharge status: Home / Self Care | Payer: BC Managed Care – PPO | Source: Ambulatory Visit | Attending: Radiation Oncology | Admitting: Radiation Oncology

## 2015-11-12 ENCOUNTER — Ambulatory Visit
Admission: RE | Admit: 2015-11-12 | Discharge: 2015-11-12 | Disposition: A | Discharge status: Home / Self Care | Payer: BC Managed Care – PPO | Source: Ambulatory Visit | Attending: Radiation Oncology | Admitting: Radiation Oncology

## 2015-11-12 DIAGNOSIS — C50412 Malignant neoplasm of upper-outer quadrant of left female breast: Secondary | ICD-10-CM | POA: Diagnosis not present

## 2015-11-13 ENCOUNTER — Ambulatory Visit
Admission: RE | Admit: 2015-11-13 | Discharge: 2015-11-13 | Disposition: A | Discharge status: Home / Self Care | Payer: BC Managed Care – PPO | Source: Ambulatory Visit | Attending: Radiation Oncology | Admitting: Radiation Oncology

## 2015-11-13 DIAGNOSIS — C50412 Malignant neoplasm of upper-outer quadrant of left female breast: Secondary | ICD-10-CM | POA: Diagnosis not present

## 2015-11-14 ENCOUNTER — Ambulatory Visit
Admission: RE | Admit: 2015-11-14 | Discharge: 2015-11-14 | Disposition: A | Discharge status: Home / Self Care | Payer: BC Managed Care – PPO | Source: Ambulatory Visit | Attending: Radiation Oncology | Admitting: Radiation Oncology

## 2015-11-14 ENCOUNTER — Inpatient Hospital Stay: Payer: BC Managed Care – PPO | Attending: Radiation Oncology

## 2015-11-14 DIAGNOSIS — C50412 Malignant neoplasm of upper-outer quadrant of left female breast: Secondary | ICD-10-CM

## 2015-11-14 DIAGNOSIS — Z170 Estrogen receptor positive status [ER+]: Secondary | ICD-10-CM | POA: Diagnosis not present

## 2015-11-14 LAB — CBC
HCT: 41.1 % (ref 35.0–47.0)
HEMOGLOBIN: 14.1 g/dL (ref 12.0–16.0)
MCH: 31.5 pg (ref 26.0–34.0)
MCHC: 34.3 g/dL (ref 32.0–36.0)
MCV: 91.7 fL (ref 80.0–100.0)
Platelets: 254 10*3/uL (ref 150–440)
RBC: 4.48 MIL/uL (ref 3.80–5.20)
RDW: 13 % (ref 11.5–14.5)
WBC: 7.1 10*3/uL (ref 3.6–11.0)

## 2015-11-15 ENCOUNTER — Ambulatory Visit
Admission: RE | Admit: 2015-11-15 | Discharge: 2015-11-15 | Disposition: A | Discharge status: Home / Self Care | Payer: BC Managed Care – PPO | Source: Ambulatory Visit | Attending: Radiation Oncology | Admitting: Radiation Oncology

## 2015-11-15 DIAGNOSIS — C50412 Malignant neoplasm of upper-outer quadrant of left female breast: Secondary | ICD-10-CM | POA: Diagnosis not present

## 2015-11-16 ENCOUNTER — Ambulatory Visit
Admission: RE | Admit: 2015-11-16 | Discharge: 2015-11-16 | Disposition: A | Discharge status: Home / Self Care | Payer: BC Managed Care – PPO | Source: Ambulatory Visit | Attending: Radiation Oncology | Admitting: Radiation Oncology

## 2015-11-16 DIAGNOSIS — C50412 Malignant neoplasm of upper-outer quadrant of left female breast: Secondary | ICD-10-CM | POA: Diagnosis not present

## 2015-11-19 ENCOUNTER — Ambulatory Visit
Admission: RE | Admit: 2015-11-19 | Discharge: 2015-11-19 | Disposition: A | Discharge status: Home / Self Care | Payer: BC Managed Care – PPO | Source: Ambulatory Visit | Attending: Radiation Oncology | Admitting: Radiation Oncology

## 2015-11-19 DIAGNOSIS — C50412 Malignant neoplasm of upper-outer quadrant of left female breast: Secondary | ICD-10-CM | POA: Diagnosis not present

## 2015-11-20 ENCOUNTER — Ambulatory Visit
Admission: RE | Admit: 2015-11-20 | Discharge: 2015-11-20 | Disposition: A | Discharge status: Home / Self Care | Payer: BC Managed Care – PPO | Source: Ambulatory Visit | Attending: Radiation Oncology | Admitting: Radiation Oncology

## 2015-11-20 DIAGNOSIS — C50412 Malignant neoplasm of upper-outer quadrant of left female breast: Secondary | ICD-10-CM | POA: Diagnosis not present

## 2015-11-21 ENCOUNTER — Ambulatory Visit: Payer: BC Managed Care – PPO

## 2015-11-21 ENCOUNTER — Ambulatory Visit
Admission: RE | Admit: 2015-11-21 | Discharge: 2015-11-21 | Disposition: A | Discharge status: Home / Self Care | Payer: BC Managed Care – PPO | Source: Ambulatory Visit | Attending: Radiation Oncology | Admitting: Radiation Oncology

## 2015-11-22 ENCOUNTER — Ambulatory Visit
Admission: RE | Admit: 2015-11-22 | Discharge: 2015-11-22 | Disposition: A | Discharge status: Home / Self Care | Payer: BC Managed Care – PPO | Source: Ambulatory Visit | Attending: Radiation Oncology | Admitting: Radiation Oncology

## 2015-11-22 DIAGNOSIS — C50412 Malignant neoplasm of upper-outer quadrant of left female breast: Secondary | ICD-10-CM | POA: Diagnosis not present

## 2015-12-18 ENCOUNTER — Inpatient Hospital Stay: Payer: BC Managed Care – PPO | Attending: Oncology | Admitting: Oncology

## 2015-12-18 ENCOUNTER — Encounter: Payer: Self-pay | Admitting: Radiation Oncology

## 2015-12-18 ENCOUNTER — Ambulatory Visit
Admission: RE | Admit: 2015-12-18 | Discharge: 2015-12-18 | Disposition: A | Discharge status: Home / Self Care | Payer: BC Managed Care – PPO | Source: Ambulatory Visit | Attending: Radiation Oncology | Admitting: Radiation Oncology

## 2015-12-18 VITALS — BP 151/91 | HR 79 | Temp 96.5°F | Resp 20 | Wt 193.8 lb

## 2015-12-18 DIAGNOSIS — R011 Cardiac murmur, unspecified: Secondary | ICD-10-CM | POA: Diagnosis not present

## 2015-12-18 DIAGNOSIS — Z923 Personal history of irradiation: Secondary | ICD-10-CM | POA: Insufficient documentation

## 2015-12-18 DIAGNOSIS — Z8619 Personal history of other infectious and parasitic diseases: Secondary | ICD-10-CM | POA: Diagnosis not present

## 2015-12-18 DIAGNOSIS — Z170 Estrogen receptor positive status [ER+]: Secondary | ICD-10-CM | POA: Insufficient documentation

## 2015-12-18 DIAGNOSIS — C50412 Malignant neoplasm of upper-outer quadrant of left female breast: Secondary | ICD-10-CM | POA: Diagnosis not present

## 2015-12-18 DIAGNOSIS — D649 Anemia, unspecified: Secondary | ICD-10-CM | POA: Insufficient documentation

## 2015-12-18 DIAGNOSIS — M129 Arthropathy, unspecified: Secondary | ICD-10-CM | POA: Diagnosis not present

## 2015-12-18 DIAGNOSIS — Z79899 Other long term (current) drug therapy: Secondary | ICD-10-CM | POA: Diagnosis not present

## 2015-12-18 DIAGNOSIS — Z85038 Personal history of other malignant neoplasm of large intestine: Secondary | ICD-10-CM | POA: Insufficient documentation

## 2015-12-18 DIAGNOSIS — Z79811 Long term (current) use of aromatase inhibitors: Secondary | ICD-10-CM | POA: Diagnosis not present

## 2015-12-18 DIAGNOSIS — Z87891 Personal history of nicotine dependence: Secondary | ICD-10-CM | POA: Diagnosis not present

## 2015-12-18 MED ORDER — LETROZOLE 2.5 MG PO TABS: 2.5000 mg | ORAL_TABLET | Freq: Every day | ORAL | Status: DC

## 2015-12-18 NOTE — Progress Notes (Signed)
[facility]  [phone] [phone]  ID: Debra Joseph OB: 1950/07/25  MR#: [id number]  CSN#:[id number]  Patient Care Team: Lottie Mussel III, MD as PCP - General (Internal Medicine)  CHIEF COMPLAINT: Pathologic stage Ia ER/PR positive, HER-2 negative adenocarcinoma of the upper outer quadrant of left breast. Oncotype DX score 13 which is low risk.  INTERVAL HISTORY: Patient returns to clinic today for further evaluation and to initiate aromatase inhibitor. She has now completed her XRT and tolerated it well without significant side effects. She currently feels well and is asymptomatic. She has no neurologic complaints. She denies any recent fevers or illnesses. She denies any pain. She has no chest pain or shortness of breath. She denies any nausea, vomiting, constipation, or diarrhea. She has no urinary complaints. Patient offers no specific complaints today.  REVIEW OF SYSTEMS:   Review of Systems  Constitutional: Negative.  Negative for fever, weight loss and malaise/fatigue.  Respiratory: Negative.  Negative for sputum production.   Cardiovascular: Negative.  Negative for chest pain.  Gastrointestinal: Negative.  Negative for abdominal pain.  Genitourinary: Negative.   Musculoskeletal: Negative.   Neurological: Negative.  Negative for weakness.  Psychiatric/Behavioral: The patient is not nervous/anxious.     As per HPI. Otherwise, a complete review of systems is negatve.  PAST MEDICAL HISTORY: Past Medical History  Diagnosis Date  . Cancer ([place]) 2005    COLON CA  . Colon cancer ([place])   . Arthritis   . Heart murmur   . Hepatitis B     AGE 65  . Hepatitis C 1999    NO CIRRHOSIS  . Anemia   . Family history of adverse reaction to anesthesia     MOM-NAUSEATED  . Seasonal allergies   . Dysrhythmia     PALPITATIONS    PAST SURGICAL HISTORY: Past Surgical History  Procedure Laterality Date  . Breast biopsy Left 08/09/2015    2  areas path pending  . Tubal ligation    . Colonoscopy      MULTIPLE  . Abdominal hysterectomy      pt states hemorrhage during hysterectomy but had never had any problems with prior surgery  . Partial mastectomy with needle localization Left 09/04/2015    Procedure: PARTIAL MASTECTOMY WITH PREOP XRAY NEEDLE LOCALIZATION;  Surgeon: Leonie Green, MD;  Location: [facility] ORS;  Service: General;  Laterality: Left;  Marland Kitchen Mastectomy w/ sentinel node biopsy Left 09/04/2015    Procedure: MASTECTOMY WITH SENTINEL LYMPH NODE BIOPSY;  Surgeon: Leonie Green, MD;  Location: [facility] ORS;  Service: General;  Laterality: Left;  . Excision of breast lesion Left 09/04/2015    Procedure: EXCISION OF SKIN LESION @ AREOLA LEFT BREAST;  Surgeon: Leonie Green, MD;  Location: [facility] ORS;  Service: General;  Laterality: Left;    FAMILY HISTORY Family History  Problem Relation Age of Onset  . Breast cancer Neg Hx        ADVANCED DIRECTIVES:    HEALTH MAINTENANCE: Social History  Substance Use Topics  . Smoking status: Former Smoker -- 0.50 packs/day for 35 years    Types: E-cigarettes  . Smokeless tobacco: Former Systems developer    Quit date: 08/19/2012     Comment: VAPOR CIG   . Alcohol Use: No     Colonoscopy:  PAP:  Bone density:  Lipid panel:  Allergies  Allergen Reactions  . Other Other (See Comments)    DARVOCET    Current Outpatient Prescriptions  Medication Sig Dispense Refill  . diphenhydrAMINE (BENADRYL) 25 MG tablet Take 25 mg by mouth every 6 (six) hours as needed.    . fluticasone (FLONASE) 50 MCG/ACT nasal spray Place 2 sprays into both nostrils as needed for allergies or rhinitis.    Marland Kitchen letrozole (FEMARA) 2.5 MG tablet Take 1 tablet (2.5 mg total) by mouth daily. 30 tablet 6  . LORazepam (ATIVAN) 0.5 MG tablet Take 0.5 mg by mouth every 8 (eight) hours as needed.     . Multiple Vitamins-Minerals (CENTRUM ULTRA WOMENS PO) Take 1 tablet by mouth daily.     . naproxen sodium (RA  NAPROXEN SODIUM) 220 MG tablet Take by mouth.    . traMADol (ULTRAM) 50 MG tablet Take 1 tablet (50 mg total) by mouth every 4 (four) hours as needed for moderate pain. 20 tablet 0   No current facility-administered medications for this visit.    OBJECTIVE: There were no vitals filed for this visit.   There is no weight on file to calculate BMI.    ECOG FS:0 - Asymptomatic  General: Well-developed, well-nourished, no acute distress. Eyes: Pink conjunctiva, anicteric sclera. Breasts: Well healing surgical scar on left breast. Lungs: Clear to auscultation bilaterally. Heart: Regular rate and rhythm. No rubs, murmurs, or gallops. Abdomen: Soft, nontender, nondistended. No organomegaly noted, normoactive bowel sounds. Musculoskeletal: No edema, cyanosis, or clubbing. Neuro: Alert, answering all questions appropriately. Cranial nerves grossly intact. Skin: No rash or petechiae noted. Psych: Normal affect.    LAB RESULTS:  No results found for: NA, K, CL, CO2, GLUCOSE, BUN, CREATININE, CALCIUM, PROT, ALBUMIN, AST, ALT, ALKPHOS, BILITOT, GFRNONAA, GFRAA  Lab Results  Component Value Date   WBC 7.1 11/14/2015   HGB 14.1 11/14/2015   HCT 41.1 11/14/2015   MCV 91.7 11/14/2015   PLT 254 11/14/2015     STUDIES: No results found.  ASSESSMENT: Pathologic stage Ia ER/PR positive, HER-2 negative adenocarcinoma of the upper outer quadrant of left breast. Oncotype DX score 13 which is low risk.  PLAN:    1. Adenocarcinoma of the upper outer quadrant of left breast: Final pathology results as above. Patient also has a low risk Oncotype score therefore adjuvant chemotherapy is not necessary. She has now completed XRT. Patient was given a prescription for letrozole which she will take for 5 years completing in July 2022. We will get baseline bone mineral density in the next 1-2 weeks. Return to clinic in 3 months for routine evaluation.  2. Left breast skin lesion: Biopsy was  nonmalignant.  Approximately 30 minutes was spent in discussion of which greater than 50% was consultation.  Patient expressed understanding and was in agreement with this plan. She also understands that She can call clinic at any time with any questions, concerns, or complaints.   Breast cancer, left [facility])   Staging form: Breast, AJCC 7th Edition     Clinical stage from 09/02/2015: Stage IA (T1c, N0, M0) - Signed by Lloyd Huger, MD on 09/02/2015   Lloyd Huger, MD   12/18/2015 10:14 AM

## 2015-12-18 NOTE — Progress Notes (Signed)
Radiation Oncology Follow up Note  Name: Debra Joseph   Date:   12/18/2015 MRN:  [id number] DOB: 19-Dec-1950    This 65 y.o. female presents to the clinic today for one-month follow-up for stage I ER/PR positive HER-2/neu negative invasive mammary carcinoma low risk recurrence on Oncotype DX status post whole breast radiation to her left breast.  REFERRING PROVIDER: Madelyn Brunner, MD  HPI: Patient is a 65 year old female now 1 month out having completed whole breast radiation to her left breast for a T1 CN 0 M0 ER/PR positive HER-2/neu negative low risk on Oncotype DX invasive mammary carcinoma status post wide local excision. She is seen today in routine follow-up and is doing well. She specifically denies breast tenderness cough or bone pain. Still has some slight hyperpigmentation of the skin noted..  COMPLICATIONS OF TREATMENT: none  FOLLOW UP COMPLIANCE: keeps appointments   PHYSICAL EXAM:  BP 151/91 mmHg  Pulse 79  Temp(Src) 96.5 F (35.8 C)  Resp 20  Wt 193 lb 12.6 oz (87.9 kg) Lungs are clear to A&P cardiac examination essentially unremarkable with regular rate and rhythm. No dominant mass or nodularity is noted in either breast in 2 positions examined. Incision is well-healed. No axillary or supraclavicular adenopathy is appreciated. Cosmetic result is excellent. Still some slight hyperpigmentation noted which I've assured her will clear the next several months. Well-developed well-nourished patient in NAD. HEENT reveals PERLA, EOMI, discs not visualized.  Oral cavity is clear. No oral mucosal lesions are identified. Neck is clear without evidence of cervical or supraclavicular adenopathy. Lungs are clear to A&P. Cardiac examination is essentially unremarkable with regular rate and rhythm without murmur rub or thrill. Abdomen is benign with no organomegaly or masses noted. Motor sensory and DTR levels are equal and symmetric in the upper and lower extremities. Cranial nerves II  through XII are grossly intact. Proprioception is intact. No peripheral adenopathy or edema is identified. No motor or sensory levels are noted. Crude visual fields are within normal range.  RADIOLOGY RESULTS: No current films for review  PLAN: Present time patient is doing well 1 month out from whole breast radiation. She will meet with medical oncology today to discuss antiestrogen therapy. I have asked to see her back in 4-5 months for follow-up. She knows to call sooner with any concerns.  I would like to take this opportunity to thank you for allowing me to participate in the care of your patient.Armstead Peaks., MD

## 2015-12-18 NOTE — Progress Notes (Signed)
Offers no complaints. Finished radiation treatments about 1 month ago.

## 2016-01-08 ENCOUNTER — Ambulatory Visit
Admission: RE | Admit: 2016-01-08 | Discharge: 2016-01-08 | Disposition: A | Discharge status: Home / Self Care | Payer: BC Managed Care – PPO | Source: Ambulatory Visit | Attending: Oncology | Admitting: Oncology

## 2016-01-08 DIAGNOSIS — C50412 Malignant neoplasm of upper-outer quadrant of left female breast: Secondary | ICD-10-CM | POA: Diagnosis not present

## 2016-01-08 DIAGNOSIS — M8588 Other specified disorders of bone density and structure, other site: Secondary | ICD-10-CM | POA: Diagnosis not present

## 2016-01-08 DIAGNOSIS — Z780 Asymptomatic menopausal state: Secondary | ICD-10-CM | POA: Insufficient documentation

## 2016-01-28 ENCOUNTER — Telehealth: Payer: Self-pay | Admitting: *Deleted

## 2016-01-28 NOTE — Telephone Encounter (Signed)
Patient advised to stop Letrozole for 2 - 3 and call back to let us know how she is doing and for further instructions. Left msg on VM

## 2016-01-28 NOTE — Telephone Encounter (Signed)
Requesting to have her Letrozole change to something else due to intense frequent hot flashes

## 2016-01-28 NOTE — Telephone Encounter (Signed)
Discontinue letrozole for 2-3 weeks. If hot flashes resolve, patient can then initiate anastrozole.

## 2016-03-03 ENCOUNTER — Telehealth: Payer: Self-pay | Admitting: *Deleted

## 2016-03-03 MED ORDER — ANASTROZOLE 1 MG PO TABS: 1.0000 mg | ORAL_TABLET | Freq: Every day | ORAL | 1 refills | Status: DC

## 2016-03-03 NOTE — Telephone Encounter (Signed)
Stopped the letrozole and the hot flashes are subsiding. Asking about being started on new med, please advise

## 2016-03-03 NOTE — Telephone Encounter (Signed)
VO Dr Grayland Ormond, Arimidex. Message left on pt VM

## 2016-03-19 ENCOUNTER — Inpatient Hospital Stay: Payer: BC Managed Care – PPO | Attending: Oncology | Admitting: Oncology

## 2016-03-19 VITALS — BP 160/84 | HR 81 | Temp 97.5°F | Resp 18 | Wt 193.9 lb

## 2016-03-19 DIAGNOSIS — Z79899 Other long term (current) drug therapy: Secondary | ICD-10-CM

## 2016-03-19 DIAGNOSIS — B1910 Unspecified viral hepatitis B without hepatic coma: Secondary | ICD-10-CM

## 2016-03-19 DIAGNOSIS — Z85038 Personal history of other malignant neoplasm of large intestine: Secondary | ICD-10-CM | POA: Insufficient documentation

## 2016-03-19 DIAGNOSIS — M129 Arthropathy, unspecified: Secondary | ICD-10-CM | POA: Diagnosis not present

## 2016-03-19 DIAGNOSIS — C50412 Malignant neoplasm of upper-outer quadrant of left female breast: Secondary | ICD-10-CM | POA: Insufficient documentation

## 2016-03-19 DIAGNOSIS — M8580 Other specified disorders of bone density and structure, unspecified site: Secondary | ICD-10-CM

## 2016-03-19 DIAGNOSIS — Z923 Personal history of irradiation: Secondary | ICD-10-CM | POA: Insufficient documentation

## 2016-03-19 DIAGNOSIS — R011 Cardiac murmur, unspecified: Secondary | ICD-10-CM | POA: Insufficient documentation

## 2016-03-19 DIAGNOSIS — Z87891 Personal history of nicotine dependence: Secondary | ICD-10-CM | POA: Insufficient documentation

## 2016-03-19 DIAGNOSIS — Z79811 Long term (current) use of aromatase inhibitors: Secondary | ICD-10-CM | POA: Diagnosis not present

## 2016-03-19 DIAGNOSIS — L988 Other specified disorders of the skin and subcutaneous tissue: Secondary | ICD-10-CM

## 2016-03-19 DIAGNOSIS — B1920 Unspecified viral hepatitis C without hepatic coma: Secondary | ICD-10-CM | POA: Insufficient documentation

## 2016-03-19 DIAGNOSIS — Z170 Estrogen receptor positive status [ER+]: Secondary | ICD-10-CM | POA: Insufficient documentation

## 2016-03-19 DIAGNOSIS — D649 Anemia, unspecified: Secondary | ICD-10-CM | POA: Diagnosis not present

## 2016-03-19 NOTE — Progress Notes (Signed)
States is feeling well. Offers no complaints. States that has been getting routine breast exams by Dr. Rochel Brome.

## 2016-03-19 NOTE — Progress Notes (Signed)
[place]  Telephone:(336) [phone] Fax:(336) [phone]  ID: Debra Joseph OB: [birth year]  MR#: [id number]  XBD#:[id number]  Patient Care Team: Lottie Mussel III, MD as PCP - General (Internal Medicine)  CHIEF COMPLAINT: Pathologic stage Ia ER/PR positive, HER-2 negative adenocarcinoma of the upper outer quadrant of left breast. Oncotype DX score 13 which is low risk.  INTERVAL HISTORY: Patient returns to clinic today for routine 3 month evaluation. She is tolerating her anastrozole well without significant side effects. She currently feels well and is asymptomatic. She has no neurologic complaints. She denies any recent fevers or illnesses. She denies any pain. She has no chest pain or shortness of breath. She denies any chest pain or shortness of breath.  She denies any nausea, vomiting, constipation, or diarrhea. She has no urinary complaints. Patient offers no specific complaints today.  REVIEW OF SYSTEMS:   Review of Systems  Constitutional: Negative.  Negative for fever, malaise/fatigue and weight loss.  Respiratory: Negative.  Negative for sputum production.   Cardiovascular: Negative.  Negative for chest pain.  Gastrointestinal: Negative.  Negative for abdominal pain.  Genitourinary: Negative.   Musculoskeletal: Negative.   Neurological: Negative.  Negative for weakness.  Psychiatric/Behavioral: The patient is not nervous/anxious.     As per HPI. Otherwise, a complete review of systems is negative.  PAST MEDICAL HISTORY: Past Medical History:  Diagnosis Date  . Anemia   . Arthritis   . Cancer ([place]) 2005   COLON CA  . Colon cancer ([place])   . Dysrhythmia    PALPITATIONS  . Family history of adverse reaction to anesthesia    MOM-NAUSEATED  . Heart murmur   . Hepatitis B    AGE 65  . Hepatitis C 1999   NO CIRRHOSIS  . Seasonal allergies     PAST SURGICAL HISTORY: Past Surgical History:  Procedure Laterality Date  . ABDOMINAL HYSTERECTOMY     pt  states hemorrhage during hysterectomy but had never had any problems with prior surgery  . BREAST BIOPSY Left 08/09/2015   2 areas path pending  . COLONOSCOPY     MULTIPLE  . EXCISION OF BREAST LESION Left 09/04/2015   Procedure: EXCISION OF SKIN LESION @ AREOLA LEFT BREAST;  Surgeon: Leonie Green, MD;  Location: [facility] OR[facility];  Service: General;  Laterality: Left;  Marland Kitchen MASTECTOMY W/ SENTINEL NODE BIOPSY Left 09/04/2015   Procedure: MASTECTOMY WITH SENTINEL LYMPH NODE BIOPSY;  Surgeon: Leonie Green, MD;  Location: [facility] OR[facility];  Service: General;  Laterality: Left;  . PARTIAL MASTECTOMY WITH NEEDLE LOCALIZATION Left 09/04/2015   Procedure: PARTIAL MASTECTOMY WITH PREOP XRAY NEEDLE LOCALIZATION;  Surgeon: Leonie Green, MD;  Location: [facility] OR[facility];  Service: General;  Laterality: Left;  . TUBAL LIGATION      FAMILY HISTORY Family History  Problem Relation Age of Onset  . Breast cancer Neg Hx        ADVANCED DIRECTIVES:    HEALTH MAINTENANCE: Social History  Substance Use Topics  . Smoking status: Former Smoker    Packs/day: 0.50    Years: 35.00    Types: E-cigarettes  . Smokeless tobacco: Former Systems developer    Quit date: 08/19/2012     Comment: VAPOR CIG   . Alcohol use No     Colonoscopy:  PAP:  Bone density:  Lipid panel:  Allergies  Allergen Reactions  . Other Other (See Comments)    DARVOCET    Current Outpatient Prescriptions  Medication Sig Dispense Refill  .  anastrozole (ARIMIDEX) 1 MG tablet Take 1 tablet (1 mg total) by mouth daily. 30 tablet 1  . diphenhydrAMINE (BENADRYL) 25 MG tablet Take 25 mg by mouth every 6 (six) hours as needed.    . fluticasone (FLONASE) 50 MCG/ACT nasal spray Place 2 sprays into both nostrils as needed for allergies or rhinitis.    . Multiple Vitamins-Minerals (CENTRUM ULTRA WOMENS PO) Take 1 tablet by mouth daily.     . naproxen sodium (RA NAPROXEN SODIUM) 220 MG tablet Take by mouth.     No current facility-administered medications for  this visit.     OBJECTIVE: Vitals:   03/19/16 1001  BP: (!) 160/84  Pulse: 81  Resp: 18  Temp: 97.5 F (36.4 C)     Body mass index is 34.35 kg/m.    ECOG FS:0 - Asymptomatic  General: Well-developed, well-nourished, no acute distress. Eyes: Pink conjunctiva, anicteric sclera. Breasts: Well healing surgical scar on left breast. Patient reports a normal breast exam by other provider. Lungs: Clear to auscultation bilaterally. Heart: Regular rate and rhythm. No rubs, murmurs, or gallops. Abdomen: Soft, nontender, nondistended. No organomegaly noted, normoactive bowel sounds. Musculoskeletal: No edema, cyanosis, or clubbing. Neuro: Alert, answering all questions appropriately. Cranial nerves grossly intact. Skin: No rash or petechiae noted. Psych: Normal affect.    LAB RESULTS:  No results found for: NA, K, CL, CO2, GLUCOSE, BUN, CREATININE, CALCIUM, PROT, ALBUMIN, AST, ALT, ALKPHOS, BILITOT, GFRNONAA, GFRAA  Lab Results  Component Value Date   WBC 7.1 11/14/2015   HGB 14.1 11/14/2015   HCT 41.1 11/14/2015   MCV 91.7 11/14/2015   PLT 254 11/14/2015     STUDIES: No results found.  ASSESSMENT: Pathologic stage Ia ER/PR positive, HER-2 negative adenocarcinoma of the upper outer quadrant of left breast. Oncotype DX score 13 which is low risk.  PLAN:    1. Adenocarcinoma of the upper outer quadrant of left breast: Final pathology results as above. Patient also has a low risk Oncotype score therefore adjuvant chemotherapy is not necessary. She has now completed XRT. Patient could not tolerate letrozole, therefore she was switched to anastrozole and is tolerating this well. She will take for 5 years completing in July 2022. Return to clinic in 6 months for routine evaluation.  2. Left breast skin lesion: Biopsy was nonmalignant. 3. Osteopenia: Baseline bone mineral density on January 08, 2016 reported T score of -1.3. Repeat in one year.   Patient expressed understanding and  was in agreement with this plan. She also understands that She can call clinic at any time with any questions, concerns, or complaints.   Breast cancer, left [facility])   Staging form: Breast, AJCC 7th Edition     Clinical stage from 09/02/2015: Stage IA (T1c, N0, M0) - Signed by Lloyd Huger, MD on 09/02/2015   Lloyd Huger, MD   03/22/2016 7:50 AM

## 2016-05-07 ENCOUNTER — Other Ambulatory Visit: Payer: Self-pay | Admitting: *Deleted

## 2016-05-07 MED ORDER — ANASTROZOLE 1 MG PO TABS: 1.0000 mg | ORAL_TABLET | Freq: Every day | ORAL | 1 refills | Status: DC

## 2016-06-11 ENCOUNTER — Ambulatory Visit
Admission: RE | Admit: 2016-06-11 | Discharge: 2016-06-11 | Disposition: A | Discharge status: Home / Self Care | Payer: BC Managed Care – PPO | Source: Ambulatory Visit | Attending: Radiation Oncology | Admitting: Radiation Oncology

## 2016-06-11 ENCOUNTER — Encounter: Payer: Self-pay | Admitting: Radiation Oncology

## 2016-06-11 VITALS — BP 137/84 | HR 83 | Temp 98.0°F | Wt 190.6 lb

## 2016-06-11 DIAGNOSIS — Z79811 Long term (current) use of aromatase inhibitors: Secondary | ICD-10-CM | POA: Diagnosis not present

## 2016-06-11 DIAGNOSIS — R5383 Other fatigue: Secondary | ICD-10-CM | POA: Diagnosis not present

## 2016-06-11 DIAGNOSIS — Z923 Personal history of irradiation: Secondary | ICD-10-CM | POA: Diagnosis not present

## 2016-06-11 DIAGNOSIS — Z170 Estrogen receptor positive status [ER+]: Secondary | ICD-10-CM | POA: Insufficient documentation

## 2016-06-11 DIAGNOSIS — C50412 Malignant neoplasm of upper-outer quadrant of left female breast: Secondary | ICD-10-CM | POA: Insufficient documentation

## 2016-06-11 NOTE — Progress Notes (Signed)
Radiation Oncology Follow up Note  Name: Debra Joseph   Date:   06/11/2016 MRN:  [id number] DOB: 04-27-1951    This 66 y.o. female presents to the clinic today for 6 month follow-up status post whole breast radiation to her left breast for stage I ER/PR positive invasive mammary carcinoma.  REFERRING PROVIDER: Madelyn Brunner, MD  HPI: Patient is a 66 year old female now out 6 months having completed whole breast radiation to her left breast for stage I (T1 CN 0 M0) ER/PR positive HER-2/neu negative invasive mammary carcinoma. She seen today in routine follow-up and is doing well only complaint is some fatigue.. She specifically denies breast tenderness cough or bone pain. Has some dryness of the skin for which she's using several creams around her nipple region. She is currently on room and asked tolerating that well without side effect.  COMPLICATIONS OF TREATMENT: none  FOLLOW UP COMPLIANCE: keeps appointments   PHYSICAL EXAM:  BP 137/84   Pulse 83   Temp 98 F (36.7 C)   Wt 190 lb 9.4 oz (86.4 kg)   BMI 33.76 kg/m  Lungs are clear to A&P cardiac examination essentially unremarkable with regular rate and rhythm. No dominant mass or nodularity is noted in either breast in 2 positions examined. Incision is well-healed. No axillary or supraclavicular adenopathy is appreciated. Cosmetic result is excellent. Well-developed well-nourished patient in NAD. HEENT reveals PERLA, EOMI, discs not visualized.  Oral cavity is clear. No oral mucosal lesions are identified. Neck is clear without evidence of cervical or supraclavicular adenopathy. Lungs are clear to A&P. Cardiac examination is essentially unremarkable with regular rate and rhythm without murmur  rub or thrill. Abdomen is benign with no organomegaly or masses noted. Motor sensory and DTR levels are equal and symmetric in the upper and lower extremities. Cranial nerves II through XII are grossly intact. Proprioception is intact. No  peripheral adenopathy or edema is identified. No motor or sensory levels are noted. Crude visual fields are within normal range.  RADIOLOGY RESULTS: Mammograms have been scheduled for early spring  PLAN: Present time patient is doing well with no evidence of disease. I've emphasized how important exercises at least a daily walk to help with her fatigue and overall general condition. She continues on aromatase without side effect. I've asked to see her back in 6 months for follow-up and then will go to once your follow-up visits. She is scheduled for mammograms in early spring which we will reconfirm. Patient knows to call with any concerns.  I would like to take this opportunity to thank you for allowing me to participate in the care of your patient.Armstead Peaks., MD

## 2016-07-31 IMAGING — MG MM BREAST SURGICAL SPECIMEN
1 series · 1 of 1 positions shown · non-contrast
Comparison: Previous exam(s).

CLINICAL DATA: Patient is post needle localization subsequent
surgical excision of biopsy proven malignancy over the upper-outer
left breast.

EXAM:
SPECIMEN RADIOGRAPH OF THE LEFT BREAST

[L SPECIMEN]
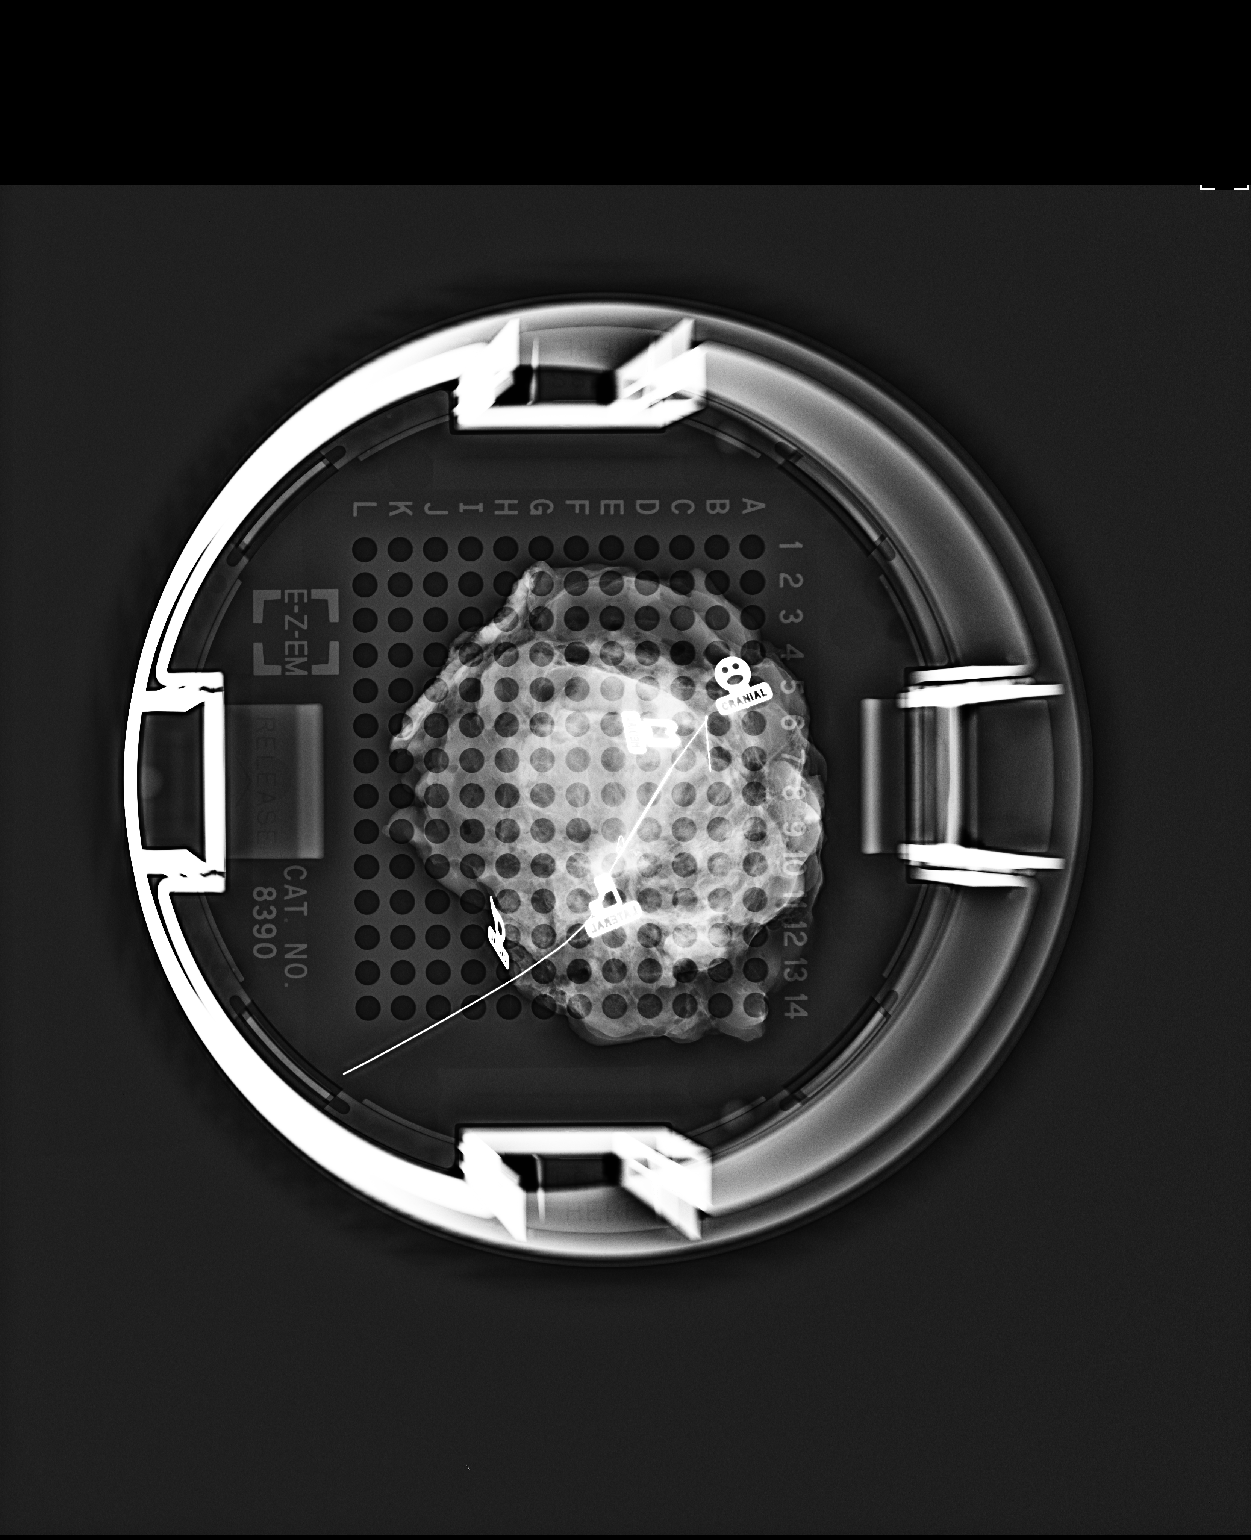

[1 of 1 positions shown; findings below may reference images not displayed]

FINDINGS: Status post excision of the left breast. The wire tip and biopsy
marker clip are present and are marked for pathology.
IMPRESSION: Specimen radiograph of the left breast.

## 2016-09-04 ENCOUNTER — Other Ambulatory Visit: Payer: Self-pay | Admitting: Surgery

## 2016-09-04 DIAGNOSIS — Z853 Personal history of malignant neoplasm of breast: Secondary | ICD-10-CM

## 2016-09-16 ENCOUNTER — Ambulatory Visit
Admission: RE | Admit: 2016-09-16 | Discharge: 2016-09-16 | Disposition: A | Discharge status: Home / Self Care | Payer: BC Managed Care – PPO | Source: Ambulatory Visit | Attending: Surgery | Admitting: Surgery

## 2016-09-16 ENCOUNTER — Other Ambulatory Visit: Payer: BC Managed Care – PPO

## 2016-09-16 ENCOUNTER — Ambulatory Visit: Payer: BC Managed Care – PPO

## 2016-09-16 DIAGNOSIS — Z853 Personal history of malignant neoplasm of breast: Secondary | ICD-10-CM

## 2016-09-16 NOTE — Progress Notes (Signed)
**Note Debra-Identified via Obfuscation** Debra Joseph  Telephone:(336) [phone] Fax:(336) [phone]  ID: Debra Joseph OB: Apr 26, 1951  MR#: [id number]  HFW#:[id number]  Patient Care Team: Lottie Mussel III, MD as PCP - General (Internal Medicine)  CHIEF COMPLAINT: Pathologic stage Ia ER/PR positive, HER-2 negative adenocarcinoma of the upper outer quadrant of left breast. Oncotype DX score 13 which is low risk.  INTERVAL HISTORY: Patient returns to clinic today for routine 6 month evaluation. She is tolerating her anastrozole well without significant side effects. She currently feels well and is asymptomatic. She has no neurologic complaints. She denies any recent fevers or illnesses. She denies any pain. She denies any chest pain or shortness of breath.  She denies any nausea, vomiting, constipation, or diarrhea. She has no urinary complaints. Patient offers no specific complaints today.  REVIEW OF SYSTEMS:   Review of Systems  Constitutional: Negative.  Negative for fever, malaise/fatigue and weight loss.  Respiratory: Negative.  Negative for cough and sputum production.   Cardiovascular: Negative.  Negative for chest pain and leg swelling.  Gastrointestinal: Negative.  Negative for abdominal pain.  Genitourinary: Negative.   Musculoskeletal: Negative.   Skin: Negative.  Negative for rash.  Neurological: Negative.  Negative for weakness.  Psychiatric/Behavioral: Negative.  The patient is not nervous/anxious.     As per HPI. Otherwise, a complete review of systems is negative.  PAST MEDICAL HISTORY: Past Medical History:  Diagnosis Date  . Anemia   . Arthritis   . Cancer ([place]) 2005   COLON CA  . Colon cancer ([place])   . Dysrhythmia    PALPITATIONS  . Family history of adverse reaction to anesthesia    MOM-NAUSEATED  . Heart murmur   . Hepatitis B    AGE 66  . Hepatitis C 1999   NO CIRRHOSIS  . Seasonal allergies     PAST SURGICAL HISTORY: Past Surgical History:  Procedure Laterality Date  .  ABDOMINAL HYSTERECTOMY     pt states hemorrhage during hysterectomy but had never had any problems with prior surgery  . BREAST BIOPSY Left 08/09/2015   2 areas path pending  . COLONOSCOPY     MULTIPLE  . EXCISION OF BREAST LESION Left 09/04/2015   Procedure: EXCISION OF SKIN LESION @ AREOLA LEFT BREAST;  Surgeon: Leonie Green, MD;  Location: [facility];  Service: General;  Laterality: Left;  Marland Kitchen MASTECTOMY W/ SENTINEL NODE BIOPSY Left 09/04/2015   Procedure: MASTECTOMY WITH SENTINEL LYMPH NODE BIOPSY;  Surgeon: Leonie Green, MD;  Location: [facility] OR[facility];  Service: General;  Laterality: Left;  . PARTIAL MASTECTOMY WITH NEEDLE LOCALIZATION Left 09/04/2015   Procedure: PARTIAL MASTECTOMY WITH PREOP XRAY NEEDLE LOCALIZATION;  Surgeon: Leonie Green, MD;  Location: [facility] OR[facility];  Service: General;  Laterality: Left;  . TUBAL LIGATION      FAMILY HISTORY Family History  Problem Relation Age of Onset  . Breast cancer Neg Hx        ADVANCED DIRECTIVES:    HEALTH MAINTENANCE: Social History  Substance Use Topics  . Smoking status: Former Smoker    Packs/day: 0.50    Years: 35.00    Types: E-cigarettes  . Smokeless tobacco: Former Systems developer    Quit date: 08/19/2012     Comment: VAPOR CIG   . Alcohol use No     Colonoscopy:  PAP:  Bone density:  Lipid panel:  Allergies  Allergen Reactions  . Other Other (See Comments)    DARVOCET    Current Outpatient Prescriptions  Medication Sig Dispense Refill  . anastrozole (ARIMIDEX) 1 MG tablet Take 1 tablet (1 mg total) by mouth daily. 90 tablet 1  . diphenhydrAMINE (BENADRYL) 25 MG tablet Take 25 mg by mouth every 6 (six) hours as needed.    . fluticasone (FLONASE) 50 MCG/ACT nasal spray Place 2 sprays into both nostrils as needed for allergies or rhinitis.    . Multiple Vitamins-Minerals (CENTRUM ULTRA WOMENS PO) Take 1 tablet by mouth daily.     . naproxen sodium (RA NAPROXEN SODIUM) 220 MG tablet Take by mouth.     No current  facility-administered medications for this visit.     OBJECTIVE: Vitals:   09/17/16 1036  BP: 136/82  Pulse: 66  Resp: 18  Temp: (!) 96.4 F (35.8 C)     Body mass index is 31.32 kg/m.    ECOG FS:0 - Asymptomatic  General: Well-developed, well-nourished, no acute distress. Eyes: Pink conjunctiva, anicteric sclera. Breasts: Well healing surgical scar on left breast. Patient reports a normal breast exam by other provider. Lungs: Clear to auscultation bilaterally. Heart: Regular rate and rhythm. No rubs, murmurs, or gallops. Abdomen: Soft, nontender, nondistended. No organomegaly noted, normoactive bowel sounds. Musculoskeletal: No edema, cyanosis, or clubbing. Neuro: Alert, answering all questions appropriately. Cranial nerves grossly intact. Skin: No rash or petechiae noted. Psych: Normal affect.    LAB RESULTS:  No results found for: NA, K, CL, CO2, GLUCOSE, BUN, CREATININE, CALCIUM, PROT, ALBUMIN, AST, ALT, ALKPHOS, BILITOT, GFRNONAA, GFRAA  Lab Results  Component Value Date   WBC 7.1 11/14/2015   HGB 14.1 11/14/2015   HCT 41.1 11/14/2015   MCV 91.7 11/14/2015   PLT 254 11/14/2015     STUDIES: Mm Diag Breast Tomo Bilateral  Result Date: 09/16/2016 CLINICAL DATA:  History of left breast cancer in 2017 status post lumpectomy and radiation therapy. EXAM: 2D DIGITAL DIAGNOSTIC BILATERAL MAMMOGRAM WITH CAD AND ADJUNCT TOMO COMPARISON:  Previous exam(s). ACR Breast Density Category c: The breast tissue is heterogeneously dense, which may obscure small masses. FINDINGS: There are expected postsurgical changes within the left breast. There are no new dominant masses, suspicious calcifications or secondary signs of malignancy within either breast. Coil shaped tissue marker within the upper left breast corresponds to the site of a benign biopsy. Mammographic images were processed with CAD. IMPRESSION: No evidence of malignancy within either breast. Postsurgical changes within the  left breast. RECOMMENDATION: Bilateral diagnostic mammogram in 1 year. I have discussed the findings and recommendations with the patient. Results were also provided in writing at the conclusion of the visit. If applicable, a reminder letter will be sent to the patient regarding the next appointment. BI-RADS CATEGORY  2: Benign. Electronically Signed   By: Franki Cabot M.D.   On: 09/16/2016 10:34    ASSESSMENT: Pathologic stage Ia ER/PR positive, HER-2 negative adenocarcinoma of the upper outer quadrant of left breast. Oncotype DX score 13 which is low risk.  PLAN:    1. Pathologic stage Ia ER/PR positive, HER-2 negative adenocarcinoma of the upper outer quadrant of left breast:  Patient also has a low risk Oncotype score therefore adjuvant chemotherapy was not necessary. She has now completed XRT. Patient could not tolerate letrozole, therefore she was switched to anastrozole and is tolerating this well. She will take for 5 years completing in July 2022. Her most recent mammogram on September 16, 2016 was reported as BI-RADS 2. Repeat in April 2019. Return to clinic in 6 months for routine evaluation.  2. Left  breast skin lesion: Biopsy was nonmalignant. 3. Osteopenia: Baseline bone mineral density on January 08, 2016 reported T score of -1.3. Repeat in one year.   Patient expressed understanding and was in agreement with this plan. She also understands that She can call clinic at any time with any questions, concerns, or complaints.   Breast cancer, left [facility])   Staging form: Breast, AJCC 7th Edition     Clinical stage from 09/02/2015: Stage IA (T1c, N0, M0) - Signed by Lloyd Huger, MD on 09/02/2015   Lloyd Huger, MD   09/17/2016 10:54 AM

## 2016-09-17 ENCOUNTER — Inpatient Hospital Stay: Payer: BC Managed Care – PPO | Attending: Oncology | Admitting: Oncology

## 2016-09-17 VITALS — BP 136/82 | HR 66 | Temp 96.4°F | Resp 18 | Wt 176.8 lb

## 2016-09-17 DIAGNOSIS — I499 Cardiac arrhythmia, unspecified: Secondary | ICD-10-CM | POA: Insufficient documentation

## 2016-09-17 DIAGNOSIS — L989 Disorder of the skin and subcutaneous tissue, unspecified: Secondary | ICD-10-CM | POA: Diagnosis not present

## 2016-09-17 DIAGNOSIS — M8580 Other specified disorders of bone density and structure, unspecified site: Secondary | ICD-10-CM | POA: Diagnosis not present

## 2016-09-17 DIAGNOSIS — Z170 Estrogen receptor positive status [ER+]: Secondary | ICD-10-CM | POA: Diagnosis not present

## 2016-09-17 DIAGNOSIS — Z87891 Personal history of nicotine dependence: Secondary | ICD-10-CM | POA: Diagnosis not present

## 2016-09-17 DIAGNOSIS — Z85038 Personal history of other malignant neoplasm of large intestine: Secondary | ICD-10-CM | POA: Insufficient documentation

## 2016-09-17 DIAGNOSIS — D649 Anemia, unspecified: Secondary | ICD-10-CM | POA: Insufficient documentation

## 2016-09-17 DIAGNOSIS — R011 Cardiac murmur, unspecified: Secondary | ICD-10-CM | POA: Diagnosis not present

## 2016-09-17 DIAGNOSIS — Z79811 Long term (current) use of aromatase inhibitors: Secondary | ICD-10-CM | POA: Insufficient documentation

## 2016-09-17 DIAGNOSIS — Z79899 Other long term (current) drug therapy: Secondary | ICD-10-CM | POA: Insufficient documentation

## 2016-09-17 DIAGNOSIS — Z8619 Personal history of other infectious and parasitic diseases: Secondary | ICD-10-CM | POA: Insufficient documentation

## 2016-09-17 DIAGNOSIS — M129 Arthropathy, unspecified: Secondary | ICD-10-CM | POA: Diagnosis not present

## 2016-09-17 DIAGNOSIS — C50412 Malignant neoplasm of upper-outer quadrant of left female breast: Secondary | ICD-10-CM | POA: Diagnosis not present

## 2016-09-17 NOTE — Progress Notes (Signed)
Offers no complaints. States is feeling well. 

## 2016-10-14 ENCOUNTER — Other Ambulatory Visit: Payer: Self-pay | Admitting: Physician Assistant

## 2016-10-14 DIAGNOSIS — M5412 Radiculopathy, cervical region: Secondary | ICD-10-CM

## 2016-10-28 ENCOUNTER — Other Ambulatory Visit: Payer: Self-pay

## 2016-10-28 MED ORDER — ANASTROZOLE 1 MG PO TABS: 1.0000 mg | ORAL_TABLET | Freq: Every day | ORAL | 1 refills | Status: DC

## 2016-11-12 ENCOUNTER — Ambulatory Visit: Payer: BC Managed Care – PPO

## 2016-12-25 ENCOUNTER — Encounter: Payer: Self-pay | Admitting: Radiation Oncology

## 2016-12-25 ENCOUNTER — Ambulatory Visit
Admission: RE | Admit: 2016-12-25 | Discharge: 2016-12-25 | Disposition: A | Discharge status: Home / Self Care | Payer: BC Managed Care – PPO | Source: Ambulatory Visit | Attending: Radiation Oncology | Admitting: Radiation Oncology

## 2016-12-25 VITALS — BP 135/88 | HR 76 | Temp 96.5°F | Resp 18 | Wt 175.4 lb

## 2016-12-25 DIAGNOSIS — C50412 Malignant neoplasm of upper-outer quadrant of left female breast: Secondary | ICD-10-CM | POA: Diagnosis not present

## 2016-12-25 DIAGNOSIS — Z170 Estrogen receptor positive status [ER+]: Secondary | ICD-10-CM | POA: Insufficient documentation

## 2016-12-25 DIAGNOSIS — Z79811 Long term (current) use of aromatase inhibitors: Secondary | ICD-10-CM | POA: Diagnosis not present

## 2016-12-25 DIAGNOSIS — Z923 Personal history of irradiation: Secondary | ICD-10-CM | POA: Insufficient documentation

## 2016-12-25 NOTE — Progress Notes (Signed)
Radiation Oncology Follow up Note  Name: Debra Joseph   Date:   12/25/2016 MRN:  [id number] DOB: 19-Jan-1951    This 66 y.o. female presents to the clinic today for 1 year follow-up status post hypofractionated whole breast radiation to her left breast for stage I ER/PR positive invasive mammary carcinoma.  REFERRING PROVIDER: Madelyn Brunner, MD  HPI: Patient is a 66 year old female now seen out 1 year having completed hypofractionated course of whole breast radiation to her left breast for stage I ER/PR positive invasive mammary carcinoma seen today in routine follow-up she is doing well she did have one episode of some pain in her left breast near the lumpectomy site which I would attribute to scar tissue which isn't resolved on its own. She otherwise specifically denies breast tenderness cough or bone pain. She is currently on. Arimadex and tolerating that well without side effect.  COMPLICATIONS OF TREATMENT: none  FOLLOW UP COMPLIANCE: keeps appointments   PHYSICAL EXAM:  BP 135/88   Pulse 76   Temp (!) 96.5 F (35.8 C)   Resp 18   Wt 175 lb 6 oz (79.5 kg)   BMI 31.07 kg/m  Lungs are clear to A&P cardiac examination essentially unremarkable with regular rate and rhythm. No dominant mass or nodularity is noted in either breast in 2 positions examined. Incision is well-healed. No axillary or supraclavicular adenopathy is appreciated. Cosmetic result is excellent. Well-developed well-nourished patient in NAD. HEENT reveals PERLA, EOMI, discs not visualized.  Oral cavity is clear. No oral mucosal lesions are identified. Neck is clear without evidence of cervical or supraclavicular adenopathy. Lungs are clear to A&P. Cardiac examination is essentially unremarkable with regular rate and rhythm without murmur rub or thrill. Abdomen is benign with no organomegaly or masses noted. Motor sensory and DTR levels are equal and symmetric in the upper and lower extremities. Cranial nerves II  through XII are grossly intact. Proprioception is intact. No peripheral adenopathy or edema is identified. No motor or sensory levels are noted. Crude visual fields are within normal range.  RADIOLOGY RESULTS: Last mammograms were in April they were BI-RADS 2 and I have reviewed them  PLAN: Patient continues to do well with no evidence of disease. I'm please were overall progress. I've asked to see her back in 1 year for follow-up. She continues on arimadex without side effect. Patient knows to call with any concerns.  I would like to take this opportunity to thank you for allowing me to participate in the care of your patient.Armstead Peaks., MD

## 2016-12-26 ENCOUNTER — Other Ambulatory Visit: Payer: Self-pay | Admitting: *Deleted

## 2017-01-08 ENCOUNTER — Other Ambulatory Visit: Payer: BC Managed Care – PPO

## 2017-01-13 ENCOUNTER — Ambulatory Visit
Admission: RE | Admit: 2017-01-13 | Discharge: 2017-01-13 | Disposition: A | Discharge status: Home / Self Care | Payer: BC Managed Care – PPO | Source: Ambulatory Visit | Attending: Physician Assistant | Admitting: Physician Assistant

## 2017-01-13 DIAGNOSIS — M50222 Other cervical disc displacement at C5-C6 level: Secondary | ICD-10-CM | POA: Insufficient documentation

## 2017-01-13 DIAGNOSIS — M5412 Radiculopathy, cervical region: Secondary | ICD-10-CM | POA: Insufficient documentation

## 2017-02-19 ENCOUNTER — Ambulatory Visit
Admission: RE | Admit: 2017-02-19 | Discharge: 2017-02-19 | Disposition: A | Discharge status: Home / Self Care | Payer: BC Managed Care – PPO | Source: Ambulatory Visit | Attending: Oncology | Admitting: Oncology

## 2017-02-19 DIAGNOSIS — Z170 Estrogen receptor positive status [ER+]: Secondary | ICD-10-CM | POA: Diagnosis not present

## 2017-02-19 DIAGNOSIS — C50412 Malignant neoplasm of upper-outer quadrant of left female breast: Secondary | ICD-10-CM | POA: Diagnosis present

## 2017-02-19 DIAGNOSIS — M8580 Other specified disorders of bone density and structure, unspecified site: Secondary | ICD-10-CM | POA: Diagnosis not present

## 2017-03-16 NOTE — Progress Notes (Signed)
Debra Joseph  Telephone:(336) [phone] Fax:(336) [phone]  ID: Debra Joseph OB: [birth year]  MR#: [id number]  UUV#:[id number]  Patient Care Team: Madelyn Brunner, MD as PCP - General (Internal Medicine)  CHIEF COMPLAINT: Pathologic stage Ia ER/PR positive, HER-2 negative adenocarcinoma of the upper outer quadrant of left breast. Oncotype DX score 13 which is low risk.  INTERVAL HISTORY: Patient returns to clinic today for routine 6 month evaluation. She continues to tolerate anastrozole well without significant side effects. She currently feels well and is asymptomatic. She has no neurologic complaints. She denies any recent fevers or illnesses. She denies any pain. She denies any chest pain or shortness of breath.  She denies any nausea, vomiting, constipation, or diarrhea. She has no urinary complaints. Patient offers no specific complaints today.  REVIEW OF SYSTEMS:   Review of Systems  Constitutional: Negative.  Negative for fever, malaise/fatigue and weight loss.  Respiratory: Negative.  Negative for cough and sputum production.   Cardiovascular: Negative.  Negative for chest pain and leg swelling.  Gastrointestinal: Negative.  Negative for abdominal pain.  Genitourinary: Negative.   Musculoskeletal: Negative.   Skin: Negative.  Negative for rash.  Neurological: Negative.  Negative for sensory change and weakness.  Psychiatric/Behavioral: Negative.  The patient is not nervous/anxious.     As per HPI. Otherwise, a complete review of systems is negative.  PAST MEDICAL HISTORY: Past Medical History:  Diagnosis Date  . Anemia   . Arthritis   . Cancer ([place]) 2005   COLON CA  . Colon cancer ([place])   . Dysrhythmia    PALPITATIONS  . Family history of adverse reaction to anesthesia    MOM-NAUSEATED  . Heart murmur   . Hepatitis B    AGE 3  . Hepatitis C 1999   NO CIRRHOSIS  . Seasonal allergies     PAST SURGICAL HISTORY: Past Surgical History:    Procedure Laterality Date  . ABDOMINAL HYSTERECTOMY     pt states hemorrhage during hysterectomy but had never had any problems with prior surgery  . BREAST BIOPSY Left 08/09/2015   2 areas path pending  . COLONOSCOPY     MULTIPLE  . EXCISION OF BREAST LESION Left 09/04/2015   Procedure: EXCISION OF SKIN LESION @ AREOLA LEFT BREAST;  Surgeon: Leonie Green, MD;  Location: [facility] ORS;  Service: General;  Laterality: Left;  Marland Kitchen MASTECTOMY W/ SENTINEL NODE BIOPSY Left 09/04/2015   Procedure: MASTECTOMY WITH SENTINEL LYMPH NODE BIOPSY;  Surgeon: Leonie Green, MD;  Location: [facility] ORS;  Service: General;  Laterality: Left;  . PARTIAL MASTECTOMY WITH NEEDLE LOCALIZATION Left 09/04/2015   Procedure: PARTIAL MASTECTOMY WITH PREOP XRAY NEEDLE LOCALIZATION;  Surgeon: Leonie Green, MD;  Location: [facility] ORS;  Service: General;  Laterality: Left;  . TUBAL LIGATION      FAMILY HISTORY Family History  Problem Relation Age of Onset  . Breast cancer Neg Hx        ADVANCED DIRECTIVES:    HEALTH MAINTENANCE: Social History  Substance Use Topics  . Smoking status: Former Smoker    Packs/day: 0.50    Years: 35.00    Types: E-cigarettes  . Smokeless tobacco: Former Systems developer    Quit date: 08/19/2012     Comment: VAPOR CIG   . Alcohol use No     Colonoscopy:  PAP:  Bone density:  Lipid panel:  Allergies  Allergen Reactions  . Other Other (See Comments)    DARVOCET  Current Outpatient Prescriptions  Medication Sig Dispense Refill  . anastrozole (ARIMIDEX) 1 MG tablet Take 1 tablet (1 mg total) by mouth daily. 90 tablet 1  . diphenhydrAMINE (BENADRYL) 25 MG tablet Take 25 mg by mouth every 6 (six) hours as needed.    . fluticasone (FLONASE) 50 MCG/ACT nasal spray Place 2 sprays into both nostrils as needed for allergies or rhinitis.    . Multiple Vitamins-Minerals (CENTRUM ULTRA WOMENS PO) Take 1 tablet by mouth daily.     . naproxen sodium (RA NAPROXEN SODIUM) 220 MG tablet Take  by mouth.     No current facility-administered medications for this visit.     OBJECTIVE: Vitals:   03/18/17 1117  BP: 139/87  Pulse: 72  Resp: 18  Temp: 98 F (36.7 C)     Body mass index is 32.33 kg/m.    ECOG FS:0 - Asymptomatic  General: Well-developed, well-nourished, no acute distress. Eyes: Pink conjunctiva, anicteric sclera. Breasts: Bilateral breast and axilla without lumps or masses. Well healed surgical scar on left breast.  Lungs: Clear to auscultation bilaterally. Heart: Regular rate and rhythm. No rubs, murmurs, or gallops. Abdomen: Soft, nontender, nondistended. No organomegaly noted, normoactive bowel sounds. Musculoskeletal: No edema, cyanosis, or clubbing. Neuro: Alert, answering all questions appropriately. Cranial nerves grossly intact. Skin: No rash or petechiae noted. Psych: Normal affect.    LAB RESULTS:  No results found for: NA, K, CL, CO2, GLUCOSE, BUN, CREATININE, CALCIUM, PROT, ALBUMIN, AST, ALT, ALKPHOS, BILITOT, GFRNONAA, GFRAA  Lab Results  Component Value Date   WBC 7.1 11/14/2015   HGB 14.1 11/14/2015   HCT 41.1 11/14/2015   MCV 91.7 11/14/2015   PLT 254 11/14/2015     STUDIES: Dg Bone Density  Result Date: 02/19/2017 EXAM: DUAL X-RAY ABSORPTIOMETRY (DXA) FOR BONE MINERAL DENSITY IMPRESSION: Dear Dr. Delight Hoh, Your patient Debra Joseph completed a FRAX assessment on 02/19/2017 using the [place] (analysis version: 14.10) manufactured by [employer]. The following summarizes the results of our evaluation. PATIENT BIOGRAPHICAL: Name: Debra Joseph Patient ID: [id number] Birth Date: [birth year] Height:    63.0 in. Gender:     Female    [age]       Weight:    175.4 lbs. Ethnicity:  White                            Exam Date: 02/19/2017 FRAX* RESULTS:  (version: 3.5) 10-year Probability of Fracture1 Major Osteoporotic Fracture2 Hip Fracture 7.3% 0.4% Population: [place] (Caucasian) Risk Factors: None Based on  Femur (Left) Neck BMD 1 -The 10-year probability of fracture may be lower than reported if the patient has received treatment. 2 -Major Osteoporotic Fracture: Clinical Spine, Forearm, Hip or Shoulder *FRAX is a Materials engineer of the [employer] for Metabolic Bone Disease, a [place] (WHO) [facility]. ASSESSMENT: The probability of a major osteoporotic fracture is 7.3% within the next ten years. The probability of a hip fracture is 0.4% within the next ten years. . Dear Dr. Delight Hoh, Your patient Mara Favero completed a BMD test on 02/19/2017 using the [place] (analysis version: 14.10) manufactured by [employer]. The following summarizes the results of our evaluation. PATIENT BIOGRAPHICAL: Name: Renuka, Farfan Patient ID: [id number] Birth Date: [birth year] Height: 63.0 in. Gender: Female Exam Date: 02/19/2017 Weight: 175.4 lbs. Indications: Breast CA, Caucasian, Height  Loss, Hysterectomy, Postmenopausal Fractures: Treatments: Arimidex ASSESSMENT: The BMD measured at AP Spine L1-L3 is 0.967 g/cm2 with a T-score of -1.7. This patient is considered osteopenic according to [facility]) criteria. L-4 was excluded due to degenerative changes. Site Region Measured Measured WHO Young Adult BMD Date       Age      Classification T-score AP Spine L1-L3 02/19/2017 63.8 Osteopenia -1.7 0.967 g/cm2 AP Spine L1-L3 01/08/2016 62.6 Osteopenia -1.8 0.961 g/cm2 DualFemur Neck Left 02/19/2017 63.8 Normal -0.9 0.914 g/cm2 DualFemur Neck Left 01/08/2016 62.6 Normal -0.9 0.917 g/cm2 World Health Organization [facility]) criteria for post-menopausal, Caucasian Women: Normal:       T-score at or above -1 SD Osteopenia:   T-score between -1 and -2.5 SD Osteoporosis: T-score at or below -2.5 SD RECOMMENDATIONS: [place] recommends that FDA-approved medical therapies be considered in postmenopausal women and men age 38 or older  with a: 1. Hip or vertebral (clinical or morphometric) fracture. 2. T-score of < -2.5 at the spine or hip. 3. Ten-year fracture probability by FRAX of 3% or greater for hip fracture or 20% or greater for major osteoporotic fracture. All treatment decisions require clinical judgment and consideration of individual patient factors, including patient preferences, co-morbidities, previous drug use, risk factors not captured in the FRAX model (e.g. falls, vitamin D deficiency, increased bone turnover, interval significant decline in bone density) and possible under - or over-estimation of fracture risk by FRAX. All patients should ensure an adequate intake of dietary calcium (1200 mg/d) and vitamin D (800 IU daily) unless contraindicated. FOLLOW-UP: People with diagnosed cases of osteoporosis or at high risk for fracture should have regular bone mineral density tests. For patients eligible for Medicare, routine testing is allowed once every 2 years. The testing frequency can be increased to one year for patients who have rapidly progressing disease, those who are receiving or discontinuing medical therapy to restore bone mass, or have additional risk factors. I have reviewed this report, and agree with the above findings. [facility] Electronically Signed   By: Lowella Grip III M.D.   On: 02/19/2017 11:57    ASSESSMENT: Pathologic stage Ia ER/PR positive, HER-2 negative adenocarcinoma of the upper outer quadrant of left breast. Oncotype DX score 13 which is low risk.  PLAN:    1. Pathologic stage Ia ER/PR positive, HER-2 negative adenocarcinoma of the upper outer quadrant of left breast:  Patient had a low risk Oncotype score therefore adjuvant chemotherapy was not necessary. She completed adjuvant XRT. Patient could not tolerate letrozole, therefore she was switched to anastrozole and is tolerating this well. She will take for 5 years completing in July 2022. Her most recent mammogram on September 16, 2016 was reported as BI-RADS 2. Repeat in April 2019. Return to clinic in 6 months for routine evaluation.  2. Left breast skin lesion: Biopsy was nonmalignant. 3. Osteopenia: Repeat bone mineral density on February 19, 2017 reported T score of -1.7 which is slightly worse in 1 year prior. Continue dietary calcium and vitamin D. Repeat in September 2019.  Patient expressed understanding and was in agreement with this plan. She also understands that She can call clinic at any time with any questions, concerns, or complaints.    Breast cancer, left [facility])   Staging form: Breast, AJCC 7th Edition     Clinical stage from 09/02/2015: Stage IA (T1c, N0, M0) - Signed by Lloyd Huger, MD on 09/02/2015   Lloyd Huger, MD  03/18/2017 3:07 PM

## 2017-03-18 ENCOUNTER — Inpatient Hospital Stay: Payer: BC Managed Care – PPO | Attending: Oncology | Admitting: Oncology

## 2017-03-18 VITALS — BP 139/87 | HR 72 | Temp 98.0°F | Resp 18 | Wt 182.5 lb

## 2017-03-18 DIAGNOSIS — Z85038 Personal history of other malignant neoplasm of large intestine: Secondary | ICD-10-CM | POA: Diagnosis not present

## 2017-03-18 DIAGNOSIS — D649 Anemia, unspecified: Secondary | ICD-10-CM | POA: Insufficient documentation

## 2017-03-18 DIAGNOSIS — C50412 Malignant neoplasm of upper-outer quadrant of left female breast: Secondary | ICD-10-CM

## 2017-03-18 DIAGNOSIS — Z170 Estrogen receptor positive status [ER+]: Secondary | ICD-10-CM | POA: Insufficient documentation

## 2017-03-18 DIAGNOSIS — R011 Cardiac murmur, unspecified: Secondary | ICD-10-CM | POA: Insufficient documentation

## 2017-03-18 DIAGNOSIS — M8580 Other specified disorders of bone density and structure, unspecified site: Secondary | ICD-10-CM | POA: Diagnosis not present

## 2017-03-18 DIAGNOSIS — Z9012 Acquired absence of left breast and nipple: Secondary | ICD-10-CM | POA: Insufficient documentation

## 2017-03-18 DIAGNOSIS — Z8619 Personal history of other infectious and parasitic diseases: Secondary | ICD-10-CM | POA: Diagnosis not present

## 2017-03-18 DIAGNOSIS — M129 Arthropathy, unspecified: Secondary | ICD-10-CM | POA: Insufficient documentation

## 2017-03-18 DIAGNOSIS — Z87891 Personal history of nicotine dependence: Secondary | ICD-10-CM | POA: Diagnosis not present

## 2017-03-18 DIAGNOSIS — Z79811 Long term (current) use of aromatase inhibitors: Secondary | ICD-10-CM | POA: Diagnosis not present

## 2017-03-18 DIAGNOSIS — Z79899 Other long term (current) drug therapy: Secondary | ICD-10-CM | POA: Insufficient documentation

## 2017-03-18 NOTE — Progress Notes (Signed)
Pt in for 6 month follow up.  Denies any difficulties, voiced no concerns.

## 2017-05-27 ENCOUNTER — Other Ambulatory Visit: Payer: Self-pay | Admitting: Oncology

## 2017-06-24 ENCOUNTER — Other Ambulatory Visit: Payer: Self-pay | Admitting: Internal Medicine

## 2017-06-24 DIAGNOSIS — M4854XA Collapsed vertebra, not elsewhere classified, thoracic region, initial encounter for fracture: Secondary | ICD-10-CM

## 2017-07-07 ENCOUNTER — Ambulatory Visit
Admission: RE | Admit: 2017-07-07 | Discharge: 2017-07-07 | Disposition: A | Discharge status: Home / Self Care | Payer: BC Managed Care – PPO | Source: Ambulatory Visit | Attending: Internal Medicine | Admitting: Internal Medicine

## 2017-07-07 DIAGNOSIS — M4854XA Collapsed vertebra, not elsewhere classified, thoracic region, initial encounter for fracture: Secondary | ICD-10-CM | POA: Diagnosis present

## 2017-07-07 DIAGNOSIS — M5134 Other intervertebral disc degeneration, thoracic region: Secondary | ICD-10-CM | POA: Diagnosis not present

## 2017-07-07 DIAGNOSIS — M5144 Schmorl's nodes, thoracic region: Secondary | ICD-10-CM | POA: Diagnosis not present

## 2017-07-07 DIAGNOSIS — R600 Localized edema: Secondary | ICD-10-CM | POA: Diagnosis not present

## 2017-09-07 ENCOUNTER — Other Ambulatory Visit: Payer: Self-pay | Admitting: Oncology

## 2017-09-17 ENCOUNTER — Ambulatory Visit
Admission: RE | Admit: 2017-09-17 | Discharge: 2017-09-17 | Disposition: A | Discharge status: Home / Self Care | Payer: BC Managed Care – PPO | Source: Ambulatory Visit | Attending: Oncology | Admitting: Oncology

## 2017-09-17 DIAGNOSIS — C50412 Malignant neoplasm of upper-outer quadrant of left female breast: Secondary | ICD-10-CM

## 2017-09-17 DIAGNOSIS — Z170 Estrogen receptor positive status [ER+]: Secondary | ICD-10-CM

## 2017-09-17 DIAGNOSIS — Z853 Personal history of malignant neoplasm of breast: Secondary | ICD-10-CM | POA: Diagnosis present

## 2017-09-17 HISTORY — DX: Personal history of irradiation: Z92.3

## 2017-09-17 HISTORY — DX: Malignant neoplasm of unspecified site of unspecified female breast: C50.919

## 2017-09-20 NOTE — Progress Notes (Signed)
[place]  Telephone:(336) [phone] Fax:(336) [phone]  ID: GLYN ZENDEJAS OB: [birth year]  MR#: [id number]  PJA#:[id number]  Patient Care Team: Baxter Hire, MD as PCP - General (Internal Medicine)  CHIEF COMPLAINT: Pathologic stage Ia ER/PR positive, HER-2 negative adenocarcinoma of the upper outer quadrant of left breast. Oncotype DX score 13 which is low risk.  INTERVAL HISTORY: Patient returns to clinic today for further evaluation and routine six-month follow-up.  She is tolerating anastrozole well without significant side effects. She currently feels well and is asymptomatic. She has no neurologic complaints. She denies any recent fevers or illnesses. She denies any pain. She denies any chest pain or shortness of breath.  She denies any nausea, vomiting, constipation, or diarrhea. She has no urinary complaints.  Patient offers no specific complaints today.  REVIEW OF SYSTEMS:   Review of Systems  Constitutional: Negative.  Negative for fever, malaise/fatigue and weight loss.  Respiratory: Negative.  Negative for cough and sputum production.   Cardiovascular: Negative.  Negative for chest pain and leg swelling.  Gastrointestinal: Negative.  Negative for abdominal pain.  Genitourinary: Negative.   Musculoskeletal: Negative.   Skin: Negative.  Negative for rash.  Neurological: Negative.  Negative for sensory change, focal weakness and weakness.  Psychiatric/Behavioral: Negative.  The patient is not nervous/anxious.     As per HPI. Otherwise, a complete review of systems is negative.  PAST MEDICAL HISTORY: Past Medical History:  Diagnosis Date  . Anemia   . Arthritis   . Breast cancer ([place]) 2017   Left breast cancer  . Cancer ([place]) 2005   COLON CA  . Colon cancer ([place])   . Dysrhythmia    PALPITATIONS  . Family history of adverse reaction to anesthesia    MOM-NAUSEATED  . Heart murmur   . Hepatitis B    AGE 67  . Hepatitis C 1999   NO CIRRHOSIS    . Personal history of radiation therapy 2017   Left breast  . Seasonal allergies     PAST SURGICAL HISTORY: Past Surgical History:  Procedure Laterality Date  . ABDOMINAL HYSTERECTOMY     pt states hemorrhage during hysterectomy but had never had any problems with prior surgery  . BREAST BIOPSY Left 08/09/2015   2 areas path pending  . COLONOSCOPY     MULTIPLE  . EXCISION OF BREAST LESION Left 09/04/2015   Procedure: EXCISION OF SKIN LESION @ AREOLA LEFT BREAST;  Surgeon: Leonie Green, MD;  Location: [facility] ORS;  Service: General;  Laterality: Left;  Marland Kitchen MASTECTOMY W/ SENTINEL NODE BIOPSY Left 09/04/2015   Procedure: MASTECTOMY WITH SENTINEL LYMPH NODE BIOPSY;  Surgeon: Leonie Green, MD;  Location: [facility] ORS;  Service: General;  Laterality: Left;  . PARTIAL MASTECTOMY WITH NEEDLE LOCALIZATION Left 09/04/2015   Procedure: PARTIAL MASTECTOMY WITH PREOP XRAY NEEDLE LOCALIZATION;  Surgeon: Leonie Green, MD;  Location: [facility] ORS;  Service: General;  Laterality: Left;  . TUBAL LIGATION      FAMILY HISTORY Family History  Problem Relation Age of Onset  . Breast cancer Neg Hx        ADVANCED DIRECTIVES:    HEALTH MAINTENANCE: Social History   Tobacco Use  . Smoking status: Former Smoker    Packs/day: 0.50    Years: 35.00    Pack years: 17.50    Types: E-cigarettes  . Smokeless tobacco: Former Systems developer    Quit date: 08/19/2012  . Tobacco comment: VAPOR CIG   Substance Use  Topics  . Alcohol use: No  . Drug use: No     Colonoscopy:  PAP:  Bone density:  Lipid panel:  Allergies  Allergen Reactions  . Other Other (See Comments)    DARVOCET    Current Outpatient Medications  Medication Sig Dispense Refill  . acetaminophen (TYLENOL) 500 MG tablet Take by mouth.    Marland Kitchen anastrozole (ARIMIDEX) 1 MG tablet TAKE 1 TABLET(1 MG) BY MOUTH DAILY 90 tablet 3  . diphenhydrAMINE (BENADRYL) 25 MG tablet Take 25 mg by mouth every 6 (six) hours as needed.    . fluticasone  (FLONASE) 50 MCG/ACT nasal spray Place 2 sprays into both nostrils as needed for allergies or rhinitis.    . Multiple Vitamins-Minerals (CENTRUM ULTRA WOMENS PO) Take 1 tablet by mouth daily.     . naproxen sodium (RA NAPROXEN SODIUM) 220 MG tablet Take by mouth.     No current facility-administered medications for this visit.     OBJECTIVE: Vitals:   09/23/17 1205  BP: (!) 155/82  Pulse: 68  Resp: 18  Temp: (!) 96.8 F (36 C)     Body mass index is 34.15 kg/m.    ECOG FS:0 - Asymptomatic  General: Well-developed, well-nourished, no acute distress. Eyes: Pink conjunctiva, anicteric sclera. Breast: Bilateral breast and axilla without lumps or masses. Lungs: Clear to auscultation bilaterally. Heart: Regular rate and rhythm. No rubs, murmurs, or gallops. Abdomen: Soft, nontender, nondistended. No organomegaly noted, normoactive bowel sounds. Musculoskeletal: No edema, cyanosis, or clubbing. Neuro: Alert, answering all questions appropriately. Cranial nerves grossly intact. Skin: No rashes or petechiae noted. Psych: Normal affect.   LAB RESULTS:  No results found for: NA, K, CL, CO2, GLUCOSE, BUN, CREATININE, CALCIUM, PROT, ALBUMIN, AST, ALT, ALKPHOS, BILITOT, GFRNONAA, GFRAA  Lab Results  Component Value Date   WBC 7.1 11/14/2015   HGB 14.1 11/14/2015   HCT 41.1 11/14/2015   MCV 91.7 11/14/2015   PLT 254 11/14/2015     STUDIES: Mm Diag Breast Tomo Bilateral  Result Date: 09/17/2017 CLINICAL DATA:  History of treated left breast cancer, status post lumpectomy and radiation therapy in 2017. EXAM: DIGITAL DIAGNOSTIC BILATERAL MAMMOGRAM WITH CAD AND TOMO COMPARISON:  Previous exam(s). ACR Breast Density Category c: The breast tissue is heterogeneously dense, which may obscure small masses. FINDINGS: Mammographically, there are no suspicious masses, areas of nonsurgical architectural distortion or microcalcifications in either breast. Stable posttreatment changes in the left  breast. Mammographic images were processed with CAD. IMPRESSION: No mammographic evidence of malignancy in either breast, status post left lumpectomy. RECOMMENDATION: Diagnostic mammogram is suggested in 1 year. (Code:DM-B-01Y) I have discussed the findings and recommendations with the patient. Results were also provided in writing at the conclusion of the visit. If applicable, a reminder letter will be sent to the patient regarding the next appointment. BI-RADS CATEGORY  2: Benign. Electronically Signed   By: Fidela Salisbury M.D.   On: 09/17/2017 10:56    ASSESSMENT: Pathologic stage Ia ER/PR positive, HER-2 negative adenocarcinoma of the upper outer quadrant of left breast. Oncotype DX score 13 which is low risk.  PLAN:    1. Pathologic stage Ia ER/PR positive, HER-2 negative adenocarcinoma of the upper outer quadrant of left breast:  Patient had a low risk Oncotype score therefore adjuvant chemotherapy was not necessary. She completed adjuvant XRT. Patient could not tolerate letrozole, therefore she was switched to anastrozole and is tolerating this well. She will take for 5 years completing in July 2022.  Patient's  most recent mammogram on September 17, 2017 was reported as BI-RADS 2.  Repeat in April 2020.  Return to clinic in 6 months for routine evaluation. 2. Left breast skin lesion: Biopsy was nonmalignant. 3. Osteopenia: Bone mineral density completed on February 19, 2017 reported T score of -1.7 which was slightly worse in 1 year prior. Continue dietary calcium and vitamin D. Repeat in September 2019.  Consider Fosamax in the future if her bone density continues to get worse.  Approximately 20 minutes was spent in discussion of which greater than 50% was consultation.  Patient expressed understanding and was in agreement with this plan. She also understands that She can call clinic at any time with any questions, concerns, or complaints.    Breast cancer, left [facility])   Staging form:  Breast, AJCC 7th Edition     Clinical stage from 09/02/2015: Stage IA (T1c, N0, M0) - Signed by Lloyd Huger, MD on 09/02/2015   Lloyd Huger, MD   09/25/2017 3:41 PM

## 2017-09-23 ENCOUNTER — Other Ambulatory Visit: Payer: Self-pay

## 2017-09-23 ENCOUNTER — Inpatient Hospital Stay: Payer: BC Managed Care – PPO | Attending: Oncology | Admitting: Oncology

## 2017-09-23 VITALS — BP 155/82 | HR 68 | Temp 96.8°F | Resp 18 | Wt 192.8 lb

## 2017-09-23 DIAGNOSIS — Z9012 Acquired absence of left breast and nipple: Secondary | ICD-10-CM | POA: Diagnosis not present

## 2017-09-23 DIAGNOSIS — M8580 Other specified disorders of bone density and structure, unspecified site: Secondary | ICD-10-CM | POA: Diagnosis not present

## 2017-09-23 DIAGNOSIS — Z87891 Personal history of nicotine dependence: Secondary | ICD-10-CM | POA: Diagnosis not present

## 2017-09-23 DIAGNOSIS — R011 Cardiac murmur, unspecified: Secondary | ICD-10-CM | POA: Insufficient documentation

## 2017-09-23 DIAGNOSIS — Z923 Personal history of irradiation: Secondary | ICD-10-CM | POA: Insufficient documentation

## 2017-09-23 DIAGNOSIS — Z170 Estrogen receptor positive status [ER+]: Secondary | ICD-10-CM | POA: Diagnosis not present

## 2017-09-23 DIAGNOSIS — Z85038 Personal history of other malignant neoplasm of large intestine: Secondary | ICD-10-CM | POA: Diagnosis not present

## 2017-09-23 DIAGNOSIS — Z79811 Long term (current) use of aromatase inhibitors: Secondary | ICD-10-CM | POA: Insufficient documentation

## 2017-09-23 DIAGNOSIS — C50412 Malignant neoplasm of upper-outer quadrant of left female breast: Secondary | ICD-10-CM | POA: Diagnosis present

## 2017-09-23 DIAGNOSIS — Z8619 Personal history of other infectious and parasitic diseases: Secondary | ICD-10-CM | POA: Insufficient documentation

## 2017-09-23 DIAGNOSIS — Z79899 Other long term (current) drug therapy: Secondary | ICD-10-CM | POA: Insufficient documentation

## 2017-09-23 NOTE — Progress Notes (Signed)
Here for follow up. Per pt" doing excellent"

## 2017-09-25 DIAGNOSIS — Z853 Personal history of malignant neoplasm of breast: Secondary | ICD-10-CM | POA: Insufficient documentation

## 2017-12-24 ENCOUNTER — Encounter: Payer: Self-pay | Admitting: Radiation Oncology

## 2017-12-24 ENCOUNTER — Other Ambulatory Visit: Payer: Self-pay

## 2017-12-24 ENCOUNTER — Ambulatory Visit
Admission: RE | Admit: 2017-12-24 | Discharge: 2017-12-24 | Disposition: A | Discharge status: Home / Self Care | Payer: BC Managed Care – PPO | Source: Ambulatory Visit | Attending: Radiation Oncology | Admitting: Radiation Oncology

## 2017-12-24 VITALS — BP 154/82 | HR 73 | Temp 97.5°F | Resp 20 | Wt 191.8 lb

## 2017-12-24 DIAGNOSIS — C50912 Malignant neoplasm of unspecified site of left female breast: Secondary | ICD-10-CM | POA: Insufficient documentation

## 2017-12-24 DIAGNOSIS — C50412 Malignant neoplasm of upper-outer quadrant of left female breast: Secondary | ICD-10-CM

## 2017-12-24 DIAGNOSIS — Z170 Estrogen receptor positive status [ER+]: Secondary | ICD-10-CM | POA: Diagnosis present

## 2017-12-24 DIAGNOSIS — Z923 Personal history of irradiation: Secondary | ICD-10-CM | POA: Insufficient documentation

## 2017-12-24 NOTE — Progress Notes (Signed)
Radiation Oncology Follow up Note  Name: Debra Joseph   Date:   12/24/2017 MRN:  [id number] DOB: 22-Apr-1951    This 67 y.o. female presents to the clinic today for to year follow-up status post hypofractionated radiation therapy to her left breast for stage I ER/PR positive invasive mammary carcinoma.  REFERRING PROVIDER: Madelyn Brunner, MD  HPI: patient is a 67 year old female now out 2 years having completed radiation therapy to herleft breast in a hypofractionated course for stage I ER/PR positive invasive mammary carcinoma. Seen today in routine follow-up she is doing well. She specifically denies breast tenderness cough or bone pain..she had a mammogram back in April which I have reviewed was BI-RADS 2 benign.he's currently on tamoxifen tolerance and that well without side effect.  COMPLICATIONS OF TREATMENT: none  FOLLOW UP COMPLIANCE: keeps appointments   PHYSICAL EXAM:  BP (!) 154/82   Pulse 73   Temp (!) 97.5 F (36.4 C)   Resp 20   Wt 191 lb 12.8 oz (87 kg)   BMI 33.98 kg/m  Lungs are clear to A&P cardiac examination essentially unremarkable with regular rate and rhythm. No dominant mass or nodularity is noted in either breast in 2 positions examined. Incision is well-healed. No axillary or supraclavicular adenopathy is appreciated. Cosmetic result is excellent. Well-developed well-nourished patient in NAD. HEENT reveals PERLA, EOMI, discs not visualized.  Oral cavity is clear. No oral mucosal lesions are identified. Neck is clear without evidence of cervical or supraclavicular adenopathy. Lungs are clear to A&P. Cardiac examination is essentially unremarkable with regular rate and rhythm without murmur rub or thrill. Abdomen is benign with no organomegaly or masses noted. Motor sensory and DTR levels are equal and symmetric in the upper and lower extremities. Cranial nerves II through XII are grossly intact. Proprioception is intact. No peripheral adenopathy or edema is  identified. No motor or sensory levels are noted. Crude visual fields are within normal range.  RADIOLOGY RESULTS: mammograms are reviewed and compatible with the above-stated findings  PLAN: present time she continues to do well with no evidence of disease. I'm please were overall progress. I've asked to see her back in 1 year for follow-up. She continues on tamoxifen without side effect. She is ordered and scheduled for follow-up mammograms. Patient knows to call with any concerns.  I would like to take this opportunity to thank you for allowing me to participate in the care of your patient.Noreene Filbert, MD

## 2018-02-03 ENCOUNTER — Other Ambulatory Visit: Payer: BC Managed Care – PPO

## 2018-02-24 ENCOUNTER — Ambulatory Visit
Admission: RE | Admit: 2018-02-24 | Discharge: 2018-02-24 | Disposition: A | Discharge status: Home / Self Care | Payer: BC Managed Care – PPO | Source: Ambulatory Visit | Attending: Oncology | Admitting: Oncology

## 2018-02-24 DIAGNOSIS — Z170 Estrogen receptor positive status [ER+]: Secondary | ICD-10-CM | POA: Insufficient documentation

## 2018-02-24 DIAGNOSIS — C50412 Malignant neoplasm of upper-outer quadrant of left female breast: Secondary | ICD-10-CM | POA: Diagnosis present

## 2018-03-31 ENCOUNTER — Ambulatory Visit: Payer: BC Managed Care – PPO | Admitting: Oncology

## 2018-04-04 NOTE — Progress Notes (Signed)
[place]  [phone]) [phone] [phone]) [phone]  ID: Debra Joseph OB: 1951-01-24  MR#: [id number]  BMW#:[id number]  Patient Care Team: Baxter Hire, MD as PCP - General (Internal Medicine)  CHIEF COMPLAINT: Pathologic stage Ia ER/PR positive, HER-2 negative adenocarcinoma of the upper outer quadrant of left breast. Oncotype DX score 13 which is low risk.  INTERVAL HISTORY: Patient returns to clinic today for further evaluation and routine six-month follow-up.  She continues to tolerate anastrozole well without significant side effects.  She currently feels well and is asymptomatic.  She has no neurologic complaints. She denies any recent fevers or illnesses. She denies any pain. She denies any chest pain or shortness of breath.  She denies any nausea, vomiting, constipation, or diarrhea. She has no urinary complaints.  Patient feels at her baseline offers no specific complaints today.  REVIEW OF SYSTEMS:   Review of Systems  Constitutional: Negative.  Negative for fever, malaise/fatigue and weight loss.  Respiratory: Negative.  Negative for cough and sputum production.   Cardiovascular: Negative.  Negative for chest pain and leg swelling.  Gastrointestinal: Negative.  Negative for abdominal pain.  Genitourinary: Negative.  Negative for dysuria.  Musculoskeletal: Negative.  Negative for back pain.  Skin: Negative.  Negative for rash.  Neurological: Negative.  Negative for sensory change, focal weakness, weakness and headaches.  Psychiatric/Behavioral: Negative.  The patient is not nervous/anxious.     As per HPI. Otherwise, a complete review of systems is negative.  PAST MEDICAL HISTORY: Past Medical History:  Diagnosis Date  . Anemia   . Arthritis   . Breast cancer ([place]) 2017   Left breast cancer  . Cancer ([place]) 2005   COLON CA  . Colon cancer ([place])   . Dysrhythmia    PALPITATIONS  . Family history of adverse reaction to anesthesia    MOM-NAUSEATED  . Heart murmur   . Hepatitis B    AGE 67  . Hepatitis C 1999   NO CIRRHOSIS  . Personal history of radiation therapy 2017   Left breast  . Seasonal allergies     PAST SURGICAL HISTORY: Past Surgical History:  Procedure Laterality Date  . ABDOMINAL HYSTERECTOMY     pt states hemorrhage during hysterectomy but had never had any problems with prior surgery  . BREAST BIOPSY Left 08/09/2015   2 areas path pending  . COLONOSCOPY     MULTIPLE  . EXCISION OF BREAST LESION Left 09/04/2015   Procedure: EXCISION OF SKIN LESION @ AREOLA LEFT BREAST;  Surgeon: Leonie Green, MD;  Location: [facility] ORS;  Service: General;  Laterality: Left;  Marland Kitchen MASTECTOMY W/ SENTINEL NODE BIOPSY Left 09/04/2015   Procedure: MASTECTOMY WITH SENTINEL LYMPH NODE BIOPSY;  Surgeon: Leonie Green, MD;  Location: [facility] ORS;  Service: General;  Laterality: Left;  . PARTIAL MASTECTOMY WITH NEEDLE LOCALIZATION Left 09/04/2015   Procedure: PARTIAL MASTECTOMY WITH PREOP XRAY NEEDLE LOCALIZATION;  Surgeon: Leonie Green, MD;  Location: [facility] ORS;  Service: General;  Laterality: Left;  . TUBAL LIGATION      FAMILY HISTORY Family History  Problem Relation Age of Onset  . Breast cancer Neg Hx        ADVANCED DIRECTIVES:    HEALTH MAINTENANCE: Social History   Tobacco Use  . Smoking status: Former Smoker    Packs/day: 0.50    Years: 35.00    Pack years: 17.50    Types: E-cigarettes  . Smokeless tobacco: Former Systems developer  Quit date: 08/19/2012  . Tobacco comment: VAPOR CIG   Substance Use Topics  . Alcohol use: No  . Drug use: No     Colonoscopy:  PAP:  Bone density:  Lipid panel:  Allergies  Allergen Reactions  . Other Other (See Comments)    DARVOCET    Current Outpatient Medications  Medication Sig Dispense Refill  . acetaminophen (TYLENOL) 500 MG tablet Take by mouth.    Marland Kitchen anastrozole (ARIMIDEX) 1 MG tablet TAKE 1 TABLET(1 MG) BY MOUTH DAILY 90 tablet 3  . diphenhydrAMINE  (BENADRYL) 25 MG tablet Take 25 mg by mouth every 6 (six) hours as needed.    . fluticasone (FLONASE) 50 MCG/ACT nasal spray Place 2 sprays into both nostrils as needed for allergies or rhinitis.    . Multiple Vitamins-Minerals (CENTRUM ULTRA WOMENS PO) Take 1 tablet by mouth daily.     . naproxen sodium (RA NAPROXEN SODIUM) 220 MG tablet Take by mouth.     No current facility-administered medications for this visit.     OBJECTIVE: Vitals:   04/06/18 1002  BP: (!) 167/84  Pulse: 76  Resp: 18  Temp: (!) 95.9 F (35.5 C)     Body mass index is 34.65 kg/m.    ECOG FS:0 - Asymptomatic  General: Well-developed, well-nourished, no acute distress. Eyes: Pink conjunctiva, anicteric sclera. HEENT: Normocephalic, moist mucous membranes. Breast: Patient request exam be deferred today. Lungs: Clear to auscultation bilaterally. Heart: Regular rate and rhythm. No rubs, murmurs, or gallops. Abdomen: Soft, nontender, nondistended. No organomegaly noted, normoactive bowel sounds. Musculoskeletal: No edema, cyanosis, or clubbing. Neuro: Alert, answering all questions appropriately. Cranial nerves grossly intact. Skin: No rashes or petechiae noted. Psych: Normal affect.  LAB RESULTS:  No results found for: NA, K, CL, CO2, GLUCOSE, BUN, CREATININE, CALCIUM, PROT, ALBUMIN, AST, ALT, ALKPHOS, BILITOT, GFRNONAA, GFRAA  Lab Results  Component Value Date   WBC 7.1 11/14/2015   HGB 14.1 11/14/2015   HCT 41.1 11/14/2015   MCV 91.7 11/14/2015   PLT 254 11/14/2015     STUDIES: No results found.  ASSESSMENT: Pathologic stage Ia ER/PR positive, HER-2 negative adenocarcinoma of the upper outer quadrant of left breast. Oncotype DX score 13 which is low risk.  PLAN:    1. Pathologic stage Ia ER/PR positive, HER-2 negative adenocarcinoma of the upper outer quadrant of left breast:  Patient had a low risk Oncotype score therefore adjuvant chemotherapy was not necessary. She completed adjuvant XRT.  Patient could not tolerate letrozole, therefore she was switched to anastrozole and is tolerating this well.  Continue anastrozole for a total of 5 years completing treatment in July 2022.  Her most recent mammogram on September 17, 2017 was reported as BI-RADS 2.  Repeat in April 2020.  Return to clinic in 6 months for routine evaluation. 2.  Osteopenia: Bone mineral density completed on February 24, 2018 reported T score of -2.0 which is slightly worse than 1 year prior when the reported T score was -1.7.  Patient has been instructed to improve her calcium and vitamin D intake.  Repeat bone mineral density in September 2020.  If it gets any worse will initiate Fosamax at that time. 3.  Hypertension: Patient's blood pressure is moderately elevated today.  Continue evaluation and treatment per primary care.  Patient expressed understanding and was in agreement with this plan. She also understands that She can call clinic at any time with any questions, concerns, or complaints.    Breast cancer, left (  [place])   Staging form: Breast, AJCC 7th Edition     Clinical stage from 09/02/2015: Stage IA (T1c, N0, M0) - Signed by Lloyd Huger, MD on 09/02/2015   Lloyd Huger, MD   04/06/2018 10:48 AM

## 2018-04-06 ENCOUNTER — Inpatient Hospital Stay: Payer: Medicare Other | Attending: Oncology | Admitting: Oncology

## 2018-04-06 ENCOUNTER — Other Ambulatory Visit: Payer: Self-pay

## 2018-04-06 VITALS — BP 167/84 | HR 76 | Temp 95.9°F | Resp 18 | Wt 195.6 lb

## 2018-04-06 DIAGNOSIS — I10 Essential (primary) hypertension: Secondary | ICD-10-CM | POA: Insufficient documentation

## 2018-04-06 DIAGNOSIS — C50412 Malignant neoplasm of upper-outer quadrant of left female breast: Secondary | ICD-10-CM | POA: Diagnosis not present

## 2018-04-06 DIAGNOSIS — Z923 Personal history of irradiation: Secondary | ICD-10-CM | POA: Diagnosis not present

## 2018-04-06 DIAGNOSIS — Z170 Estrogen receptor positive status [ER+]: Secondary | ICD-10-CM | POA: Diagnosis not present

## 2018-04-06 DIAGNOSIS — Z90710 Acquired absence of both cervix and uterus: Secondary | ICD-10-CM

## 2018-04-06 DIAGNOSIS — Z79899 Other long term (current) drug therapy: Secondary | ICD-10-CM | POA: Insufficient documentation

## 2018-04-06 DIAGNOSIS — Z79811 Long term (current) use of aromatase inhibitors: Secondary | ICD-10-CM | POA: Diagnosis not present

## 2018-04-06 DIAGNOSIS — Z87891 Personal history of nicotine dependence: Secondary | ICD-10-CM | POA: Insufficient documentation

## 2018-04-06 DIAGNOSIS — M8580 Other specified disorders of bone density and structure, unspecified site: Secondary | ICD-10-CM

## 2018-04-06 NOTE — Progress Notes (Signed)
Here for follow up . Overall " feeling good "

## 2018-05-04 ENCOUNTER — Emergency Department
Admission: EM | Admit: 2018-05-04 | Discharge: 2018-05-04 | Disposition: A | Discharge status: Home / Self Care | Payer: Medicare Other | Source: Home / Self Care | Attending: Emergency Medicine | Admitting: Emergency Medicine

## 2018-05-04 ENCOUNTER — Other Ambulatory Visit: Payer: Self-pay

## 2018-05-04 ENCOUNTER — Emergency Department: Payer: Medicare Other

## 2018-05-04 DIAGNOSIS — H6691 Otitis media, unspecified, right ear: Secondary | ICD-10-CM

## 2018-05-04 DIAGNOSIS — Z85038 Personal history of other malignant neoplasm of large intestine: Secondary | ICD-10-CM

## 2018-05-04 DIAGNOSIS — R1907 Generalized intra-abdominal and pelvic swelling, mass and lump: Secondary | ICD-10-CM

## 2018-05-04 DIAGNOSIS — R112 Nausea with vomiting, unspecified: Secondary | ICD-10-CM

## 2018-05-04 DIAGNOSIS — Z87891 Personal history of nicotine dependence: Secondary | ICD-10-CM | POA: Insufficient documentation

## 2018-05-04 DIAGNOSIS — R1031 Right lower quadrant pain: Secondary | ICD-10-CM | POA: Insufficient documentation

## 2018-05-04 DIAGNOSIS — H6690 Otitis media, unspecified, unspecified ear: Secondary | ICD-10-CM

## 2018-05-04 DIAGNOSIS — Z79899 Other long term (current) drug therapy: Secondary | ICD-10-CM

## 2018-05-04 DIAGNOSIS — Z853 Personal history of malignant neoplasm of breast: Secondary | ICD-10-CM

## 2018-05-04 DIAGNOSIS — R102 Pelvic and perineal pain: Secondary | ICD-10-CM

## 2018-05-04 DIAGNOSIS — N83511 Torsion of right ovary and ovarian pedicle: Secondary | ICD-10-CM | POA: Diagnosis not present

## 2018-05-04 DIAGNOSIS — R1900 Intra-abdominal and pelvic swelling, mass and lump, unspecified site: Secondary | ICD-10-CM

## 2018-05-04 LAB — URINALYSIS, COMPLETE (UACMP) WITH MICROSCOPIC
BACTERIA UA: NONE SEEN
Bilirubin Urine: NEGATIVE
GLUCOSE, UA: NEGATIVE mg/dL
HGB URINE DIPSTICK: NEGATIVE
KETONES UR: 80 mg/dL — AB
LEUKOCYTES UA: NEGATIVE
Nitrite: NEGATIVE
PH: 5 (ref 5.0–8.0)
Protein, ur: 30 mg/dL — AB
Specific Gravity, Urine: 1.024 (ref 1.005–1.030)

## 2018-05-04 LAB — COMPREHENSIVE METABOLIC PANEL
ALBUMIN: 4.3 g/dL (ref 3.5–5.0)
ALT: 15 U/L (ref 0–44)
ANION GAP: 10 (ref 5–15)
AST: 22 U/L (ref 15–41)
Alkaline Phosphatase: 71 U/L (ref 38–126)
BUN: 13 mg/dL (ref 8–23)
CHLORIDE: 104 mmol/L (ref 98–111)
CO2: 24 mmol/L (ref 22–32)
Calcium: 9 mg/dL (ref 8.9–10.3)
Creatinine, Ser: 0.89 mg/dL (ref 0.44–1.00)
GFR calc Af Amer: >60 mL/min (ref >60)
GFR calc non Af Amer: >60 mL/min (ref >60)
GLUCOSE: 142 mg/dL — AB (ref 70–99)
POTASSIUM: 3.6 mmol/L (ref 3.5–5.1)
SODIUM: 138 mmol/L (ref 135–145)
Total Bilirubin: 0.7 mg/dL (ref 0.3–1.2)
Total Protein: 7.9 g/dL (ref 6.5–8.1)

## 2018-05-04 LAB — CBC
HEMATOCRIT: 43.7 % (ref 36.0–46.0)
Hemoglobin: 14.1 g/dL (ref 12.0–15.0)
MCH: 30.6 pg (ref 26.0–34.0)
MCHC: 32.3 g/dL (ref 30.0–36.0)
MCV: 94.8 fL (ref 80.0–100.0)
NRBC: 0 % (ref 0.0–0.2)
Platelets: 385 10*3/uL (ref 150–400)
RBC: 4.61 MIL/uL (ref 3.87–5.11)
RDW: 12.7 % (ref 11.5–15.5)
WBC: 18.7 10*3/uL — ABNORMAL HIGH (ref 4.0–10.5)

## 2018-05-04 LAB — LIPASE, BLOOD: LIPASE: 34 U/L (ref 11–51)

## 2018-05-04 MED ORDER — MORPHINE SULFATE (PF) 4 MG/ML IV SOLN
4.0000 mg | Freq: Once | INTRAVENOUS | Status: AC
  Administered 2018-05-04: 4 mg via INTRAVENOUS
  Filled 2018-05-04: qty 1

## 2018-05-04 MED ORDER — SODIUM CHLORIDE 0.9 % IV BOLUS
1000.0000 mL | Freq: Once | INTRAVENOUS | Status: AC
  Administered 2018-05-04: 1000 mL via INTRAVENOUS

## 2018-05-04 MED ORDER — IOHEXOL 300 MG/ML  SOLN
100.0000 mL | Freq: Once | INTRAMUSCULAR | Status: AC | PRN
  Administered 2018-05-04: 100 mL via INTRAVENOUS
  Filled 2018-05-04: qty 100

## 2018-05-04 MED ORDER — AMOXICILLIN 500 MG PO CAPS
1000.0000 mg | ORAL_CAPSULE | Freq: Once | ORAL | Status: AC
  Administered 2018-05-04: 1000 mg via ORAL
  Filled 2018-05-04 (×2): qty 2

## 2018-05-04 MED ORDER — OXYCODONE-ACETAMINOPHEN 5-325 MG PO TABS
1.0000 | ORAL_TABLET | Freq: Once | ORAL | Status: AC
  Administered 2018-05-04: 1 via ORAL
  Filled 2018-05-04: qty 1

## 2018-05-04 MED ORDER — ONDANSETRON HCL 4 MG PO TABS: 4.0000 mg | ORAL_TABLET | Freq: Every day | ORAL | 0 refills | Status: DC | PRN

## 2018-05-04 MED ORDER — AMOXICILLIN 875 MG PO TABS: 875.0000 mg | ORAL_TABLET | Freq: Two times a day (BID) | ORAL | 0 refills | Status: DC

## 2018-05-04 MED ORDER — OXYCODONE-ACETAMINOPHEN 5-325 MG PO TABS: 1.0000 | ORAL_TABLET | ORAL | 0 refills | Status: DC | PRN

## 2018-05-04 MED ORDER — ONDANSETRON HCL 4 MG/2ML IJ SOLN
4.0000 mg | Freq: Once | INTRAMUSCULAR | Status: AC
  Administered 2018-05-04: 4 mg via INTRAVENOUS
  Filled 2018-05-04: qty 2

## 2018-05-04 NOTE — ED Triage Notes (Signed)
RLQ pain constant that started at 1300 today. Emesis since 1300. 20 G to R AC started by EMS.

## 2018-05-04 NOTE — ED Provider Notes (Signed)
[facility] Emergency Department Provider Note  ___________________________________________   First MD Initiated Contact with Patient 05/04/18 1657     (approximate)  I have reviewed the triage vital signs and the nursing notes.   HISTORY  Chief Complaint Abdominal Pain   HPI Daneille B Tango is a 67 y.o. female with a history of breast cancer in remission on oral chemotherapy was presented to the emergency department with right lower quadrant abdominal pain over the past 5 hours.  Says the pain is a 9 out of 10 and cramping.  Associated with 8 episodes of vomiting.  No diarrhea.  Says that over the past several days she is also had a respiratory infection and has had pressure to her right ear associated with a cough as well as rhinorrhea.  Denies sore throat.   Past Medical History:  Diagnosis Date  . Anemia   . Arthritis   . Breast cancer ([place]) 2017   Left breast cancer  . Cancer ([place]) 2005   COLON CA  . Colon cancer ([place])   . Dysrhythmia    PALPITATIONS  . Family history of adverse reaction to anesthesia    MOM-NAUSEATED  . Heart murmur   . Hepatitis B    AGE 55  . Hepatitis C 1999   NO CIRRHOSIS  . Personal history of radiation therapy 2017   Left breast  . Seasonal allergies     Patient Active Problem List   Diagnosis Date Noted  . Breast cancer of upper-outer quadrant of left female breast ([place]) 09/02/2015    Past Surgical History:  Procedure Laterality Date  . ABDOMINAL HYSTERECTOMY     pt states hemorrhage during hysterectomy but had never had any problems with prior surgery  . BREAST BIOPSY Left 08/09/2015   2 areas path pending  . COLONOSCOPY     MULTIPLE  . EXCISION OF BREAST LESION Left 09/04/2015   Procedure: EXCISION OF SKIN LESION @ AREOLA LEFT BREAST;  Surgeon: Leonie Green, MD;  Location: [facility] ORS;  Service: General;  Laterality: Left;  Marland Kitchen MASTECTOMY W/ SENTINEL NODE BIOPSY Left 09/04/2015   Procedure: MASTECTOMY WITH  SENTINEL LYMPH NODE BIOPSY;  Surgeon: Leonie Green, MD;  Location: [facility] ORS;  Service: General;  Laterality: Left;  . PARTIAL MASTECTOMY WITH NEEDLE LOCALIZATION Left 09/04/2015   Procedure: PARTIAL MASTECTOMY WITH PREOP XRAY NEEDLE LOCALIZATION;  Surgeon: Leonie Green, MD;  Location: [facility] ORS;  Service: General;  Laterality: Left;  . TUBAL LIGATION      Prior to Admission medications   Medication Sig Start Date End Date Taking? Authorizing Provider  acetaminophen (TYLENOL) 500 MG tablet Take by mouth.    [provider]  anastrozole (ARIMIDEX) 1 MG tablet TAKE 1 TABLET(1 MG) BY MOUTH DAILY 09/07/17   Lloyd Huger, MD  diphenhydrAMINE (BENADRYL) 25 MG tablet Take 25 mg by mouth every 6 (six) hours as needed.    [provider]  fluticasone (FLONASE) 50 MCG/ACT nasal spray Place 2 sprays into both nostrils as needed for allergies or rhinitis.    [provider]  Multiple Vitamins-Minerals (CENTRUM ULTRA WOMENS PO) Take 1 tablet by mouth daily.     [provider]  naproxen sodium (RA NAPROXEN SODIUM) 220 MG tablet Take by mouth.    [provider]    Allergies Other  Family History  Problem Relation Age of Onset  . Breast cancer Neg Hx     Social History Social History  Tobacco Use  . Smoking status: Former Smoker    Packs/day: 0.50    Years: 35.00    Pack years: 17.50    Types: E-cigarettes  . Smokeless tobacco: Former Systems developer    Quit date: 08/19/2012  . Tobacco comment: VAPOR CIG   Substance Use Topics  . Alcohol use: No  . Drug use: No    Review of Systems  Constitutional: No fever/chills Eyes: No visual changes. ENT: As above Cardiovascular: Denies chest pain. Respiratory: Denies shortness of breath. Gastrointestinal:   No diarrhea.  No constipation. Genitourinary: Negative for dysuria. Musculoskeletal: Negative for back pain. Skin: Negative for rash. Neurological: Negative for headaches, focal weakness  or numbness.   ____________________________________________   PHYSICAL EXAM:  VITAL SIGNS: ED Triage Vitals [05/04/18 1554]  Enc Vitals Group     BP (!) 158/83     Pulse Rate (!) 58     Resp 18     Temp 97.7 F (36.5 C)     Temp Source Oral     SpO2 98 %     Weight 190 lb (86.2 kg)     Height 5\' 3"  (1.6 m)     Head Circumference      Peak Flow      Pain Score 8     Pain Loc      Pain Edu?      Excl. in [place]?     Constitutional: Alert and oriented. Well appearing and in no acute distress. Eyes: Conjunctivae are normal.  Head: Atraumatic.  Right TM with moderate erythema and moderate bulging.  Left TM is normal. Nose: Clear rhinorrhea bilaterally. Mouth/Throat: Mucous membranes are moist.  Neck: No stridor.   Cardiovascular: Normal rate, regular rhythm. Grossly normal heart sounds. Respiratory: Normal respiratory effort.  No retractions. Lungs CTAB. Gastrointestinal: Soft with moderate right lower quadrant tenderness to palpation over McBurney's point without rebound or guarding. No distention.  Musculoskeletal: No lower extremity tenderness nor edema.  No joint effusions. Neurologic:  Normal speech and language. No gross focal neurologic deficits are appreciated. Skin:  Skin is warm, dry and intact. No rash noted. Psychiatric: Mood and affect are normal. Speech and behavior are normal.  ____________________________________________   LABS (all labs ordered are listed, but only abnormal results are displayed)  Labs Reviewed  COMPREHENSIVE METABOLIC PANEL - Abnormal; Notable for the following components:      Result Value   Glucose, Bld 142 (*)    All other components within normal limits  CBC - Abnormal; Notable for the following components:   WBC 18.7 (*)    All other components within normal limits  LIPASE, BLOOD  URINALYSIS, COMPLETE (UACMP) WITH MICROSCOPIC    ____________________________________________  EKG   ____________________________________________  RADIOLOGY  CT of the abdomen was 6 x 8 x 4 cm right adnexal mass.  Recommend ultrasound versus MRI for further evaluation.  Ultrasound of the pelvis with 7.5 cm ill-defined shadowing right adnexal mass.  Finding indeterminant. ____________________________________________   PROCEDURES  Procedure(s) performed:   Procedures  Critical Care performed:   ____________________________________________   INITIAL IMPRESSION / ASSESSMENT AND PLAN / ED COURSE  Pertinent labs & imaging results that were available during my care of the patient were reviewed by me and considered in my medical decision making (see chart for details).  Differential diagnosis includes, but is not limited to, ovarian cyst, ovarian torsion, acute appendicitis, diverticulitis, urinary tract infection/pyelonephritis, endometriosis, bowel obstruction, colitis, renal colic, gastroenteritis, hernia, fibroids, endometriosis, pregnancy related pain  including ectopic pregnancy, etc. As part of my medical decision making, I reviewed the following data within the [place] Notes from prior ED visits  ----------------------------------------- 8:07 PM on 05/04/2018 -----------------------------------------  Patient feeling improved at this time.  Is tolerating p.o. fluids.  No tenderness to palpation.  She is aware of the mass and will be following up with her oncologist, Dr. Grayland Ormond.  Patient will be discharged with amoxicillin for otitis media as well as Zofran and amoxicillin.  Patient aware of the findings.  We discussed discharge versus admission and she would like to try home treatment and will return to the emergency department for any worsening or concerning symptoms. ____________________________________________   FINAL CLINICAL IMPRESSION(S) / ED DIAGNOSES  Otitis media.  Lower abdominal pain.   Pelvic mass.  NEW MEDICATIONS STARTED DURING THIS VISIT:  New Prescriptions   No medications on file     Note:  This document was prepared using Dragon voice recognition software and may include unintentional dictation errors.     Orbie Pyo, MD 05/04/18 2008

## 2018-05-04 NOTE — ED Notes (Signed)
Patient transported to Ultrasound 

## 2018-05-04 NOTE — ED Notes (Addendum)
Abd pain with vomiting. LRQ pain without tenderness. No distention.  CBG 195 without hx of diabetes.  Normal BM yesterday.  Hx of stage 1 breast cancer that is in remission 4mg  Zofran given with EMS at 1525.

## 2018-05-05 ENCOUNTER — Inpatient Hospital Stay: Payer: Medicare Other | Attending: Obstetrics and Gynecology | Admitting: Obstetrics and Gynecology

## 2018-05-05 ENCOUNTER — Inpatient Hospital Stay: Payer: Medicare Other

## 2018-05-05 ENCOUNTER — Inpatient Hospital Stay
Admission: AD | Admit: 2018-05-05 | Discharge: 2018-05-08 | DRG: 743 | Disposition: A | Discharge status: Home / Self Care | Payer: Medicare Other | Attending: Obstetrics & Gynecology | Admitting: Obstetrics & Gynecology

## 2018-05-05 ENCOUNTER — Inpatient Hospital Stay: Payer: Medicare Other | Admitting: Oncology

## 2018-05-05 VITALS — BP 124/79 | HR 72 | Temp 97.9°F | Resp 18 | Ht 63.0 in | Wt 194.1 lb

## 2018-05-05 DIAGNOSIS — R109 Unspecified abdominal pain: Secondary | ICD-10-CM | POA: Diagnosis not present

## 2018-05-05 DIAGNOSIS — D398 Neoplasm of uncertain behavior of other specified female genital organs: Secondary | ICD-10-CM | POA: Diagnosis not present

## 2018-05-05 DIAGNOSIS — Z853 Personal history of malignant neoplasm of breast: Secondary | ICD-10-CM

## 2018-05-05 DIAGNOSIS — N9489 Other specified conditions associated with female genital organs and menstrual cycle: Secondary | ICD-10-CM

## 2018-05-05 DIAGNOSIS — R102 Pelvic and perineal pain: Secondary | ICD-10-CM | POA: Diagnosis present

## 2018-05-05 DIAGNOSIS — Z87891 Personal history of nicotine dependence: Secondary | ICD-10-CM

## 2018-05-05 DIAGNOSIS — J449 Chronic obstructive pulmonary disease, unspecified: Secondary | ICD-10-CM | POA: Diagnosis present

## 2018-05-05 DIAGNOSIS — Z85038 Personal history of other malignant neoplasm of large intestine: Secondary | ICD-10-CM | POA: Diagnosis not present

## 2018-05-05 DIAGNOSIS — Z90710 Acquired absence of both cervix and uterus: Secondary | ICD-10-CM

## 2018-05-05 DIAGNOSIS — N83511 Torsion of right ovary and ovarian pedicle: Principal | ICD-10-CM | POA: Diagnosis present

## 2018-05-05 DIAGNOSIS — Z01818 Encounter for other preprocedural examination: Secondary | ICD-10-CM

## 2018-05-05 DIAGNOSIS — Z9851 Tubal ligation status: Secondary | ICD-10-CM | POA: Insufficient documentation

## 2018-05-05 LAB — COMPREHENSIVE METABOLIC PANEL
ALT: 15 U/L (ref 0–44)
AST: 36 U/L (ref 15–41)
Albumin: 4.1 g/dL (ref 3.5–5.0)
Alkaline Phosphatase: 63 U/L (ref 38–126)
Anion gap: 12 (ref 5–15)
BUN: 8 mg/dL (ref 8–23)
CO2: 24 mmol/L (ref 22–32)
Calcium: 9.2 mg/dL (ref 8.9–10.3)
Chloride: 100 mmol/L (ref 98–111)
Creatinine, Ser: 0.87 mg/dL (ref 0.44–1.00)
GFR calc non Af Amer: >60 mL/min (ref >60)
Glucose, Bld: 124 mg/dL — ABNORMAL HIGH (ref 70–99)
Potassium: 3.4 mmol/L — ABNORMAL LOW (ref 3.5–5.1)
Sodium: 136 mmol/L (ref 135–145)
Total Bilirubin: 0.8 mg/dL (ref 0.3–1.2)
Total Protein: 7.7 g/dL (ref 6.5–8.1)

## 2018-05-05 LAB — TYPE AND SCREEN
ABO/RH(D): A NEG
Antibody Screen: NEGATIVE

## 2018-05-05 LAB — CBC WITH DIFFERENTIAL/PLATELET
Abs Immature Granulocytes: 0.07 10*3/uL (ref 0.00–0.07)
BASOS ABS: 0.1 10*3/uL (ref 0.0–0.1)
Basophils Relative: 0 %
EOS PCT: 0 %
Eosinophils Absolute: 0 10*3/uL (ref 0.0–0.5)
HEMATOCRIT: 40.5 % (ref 36.0–46.0)
Hemoglobin: 13.4 g/dL (ref 12.0–15.0)
Immature Granulocytes: 0 %
Lymphocytes Relative: 10 %
Lymphs Abs: 1.9 10*3/uL (ref 0.7–4.0)
MCH: 30.7 pg (ref 26.0–34.0)
MCHC: 33.1 g/dL (ref 30.0–36.0)
MCV: 92.7 fL (ref 80.0–100.0)
Monocytes Absolute: 1.2 10*3/uL — ABNORMAL HIGH (ref 0.1–1.0)
Monocytes Relative: 6 %
Neutro Abs: 15.3 10*3/uL — ABNORMAL HIGH (ref 1.7–7.7)
Neutrophils Relative %: 84 %
Platelets: 382 10*3/uL (ref 150–400)
RBC: 4.37 MIL/uL (ref 3.87–5.11)
RDW: 12.7 % (ref 11.5–15.5)
WBC: 18.5 10*3/uL — ABNORMAL HIGH (ref 4.0–10.5)
nRBC: 0 % (ref 0.0–0.2)

## 2018-05-05 MED ORDER — DIPHENHYDRAMINE HCL 50 MG/ML IJ SOLN: 12.5000 mg | Freq: Four times a day (QID) | INTRAMUSCULAR | Status: DC | PRN

## 2018-05-05 MED ORDER — ONDANSETRON HCL 4 MG PO TABS: 4.0000 mg | ORAL_TABLET | Freq: Four times a day (QID) | ORAL | Status: DC | PRN

## 2018-05-05 MED ORDER — SODIUM CHLORIDE 0.9% FLUSH: 9.0000 mL | INTRAVENOUS | Status: DC | PRN

## 2018-05-05 MED ORDER — MORPHINE SULFATE 2 MG/ML IV SOLN
INTRAVENOUS | Status: DC
  Administered 2018-05-05: 16:00:00 via INTRAVENOUS
  Administered 2018-05-06: 1 mg via INTRAVENOUS
  Filled 2018-05-05 (×2): qty 25

## 2018-05-05 MED ORDER — SODIUM CHLORIDE 0.9 % IV SOLN
INTRAVENOUS | Status: DC
  Administered 2018-05-05 – 2018-05-07 (×5): via INTRAVENOUS

## 2018-05-05 MED ORDER — ONDANSETRON HCL 4 MG/2ML IJ SOLN
4.0000 mg | Freq: Four times a day (QID) | INTRAMUSCULAR | Status: DC | PRN
  Administered 2018-05-05 – 2018-05-07 (×6): 4 mg via INTRAVENOUS
  Filled 2018-05-05 (×6): qty 2

## 2018-05-05 MED ORDER — MORPHINE SULFATE 2 MG/ML IV SOLN
INTRAVENOUS | Status: DC
  Filled 2018-05-05: qty 30

## 2018-05-05 MED ORDER — NALOXONE HCL 0.4 MG/ML IJ SOLN: 0.4000 mg | INTRAMUSCULAR | Status: DC | PRN

## 2018-05-05 MED ORDER — DIPHENHYDRAMINE HCL 12.5 MG/5ML PO ELIX
12.5000 mg | ORAL_SOLUTION | Freq: Four times a day (QID) | ORAL | Status: DC | PRN
  Filled 2018-05-05: qty 5

## 2018-05-05 NOTE — Progress Notes (Signed)
Pt here for adnexal mass and pain in right abdomen to groin area. Pain 5. Hx of breast cancer and on arimidex. She has Upper resp. Infection and vomiting and went to ER and got atb, nausea and pain meds.  She was able to take pain and nausea pill so far this am. She was caring for her Dad who passed away recently over holiday thanksgiving with resp. Infection.Marland Kitchen

## 2018-05-05 NOTE — H&P (Signed)
Entered in error

## 2018-05-05 NOTE — Progress Notes (Signed)
Gynecologic Oncology Consult Visit   Referring Provider: Dr. Grayland Ormond  Chief Complaint: Adnexal Mass  Subjective:  Debra Joseph is a 67 y.o. female, s/p TH (benign disease with bilateral ovaries in situ) with h/o stage Ia breast cancer seen in consultation from Dr. Grayland Ormond for right adnexal mass.   She presented to ER on 05/04/18 for RLQ abdominal pain with emesis.   05/04/18 CT Abdomen Pelvis w contrast which showed 6 x 8 x 4 cm lobulated mass containing calcifications in right adnexal region.   05/04/18- US Pelvis with transvaginal and pelvic doppler showed:  1. 7.5 cm ill-defined shadowing right adnexal mass, corresponding with lesion seen on prior CT. Finding is indeterminate, with differential considerations including a possible solid ovarian neoplasm, although no definite native ovarian tissue identified on this exam. Possible mass arising from the GI tract such is a GIST tumor could also be considered, as this lesion appears to closely approximate several loops of bowel on prior CT. Gynecologic referral for surgical consultation recommended. Additionally, further assessment with dedicated MRI of the pelvis, with and without contrast, suggested for further characterization.  2. Status post hysterectomy with nonvisualization of the uterus and left ovary. No other acute abnormality within the pelvis. No free fluid.  She reports spotting in 2018 and some in 2019 which she accounted to forgetting Arimidex at the time.   She reports history of abnormal pap smears with 'cautery' followed by normal pap smears but estimates this was ~ 25 years ago. No recent pap smears.   She has history of abdominal hysterectomy for uterine fibroids and heavy bleeding in 1990s.  She had post-op bleeding which required return to OR and blood transfusion.   Current every day Vape use with 3% nicotine. Is interested in quitting. Reports recent stress as contributor with recent passing of her father.   History of  hepatitis c and b; not currently on treatment.   She has history of pathologic stage Ia ER/PR positive, HER-2 negative adenocarcinoma of upper outer quadrant of left breast s/p left partial mastectomy with sentinel lymph node biopsy on 09/04/15 with Dr. Tamala Julian. Dimension was 10m. SLN was negative. She received adjuvant XRT, completed on 11/22/15. She is currently on anastrozole adjuvantly with plan for 5 years of therapy.   On her PMH is states she has a h/o colon cancer but we could not find verification. It does appear she had colonic polyps. Colonoscopy in 2011 was negative.   She presents today for evaluation and complains of increasing pain (pain score 9) with nausea and inability to tolerate oral intake.   Problem List: Patient Active Problem List   Diagnosis Date Noted  . Adnexal mass 05/05/2018  . Breast cancer of upper-outer quadrant of left female breast ([facility] 09/02/2015    Past Medical History: Past Medical History:  Diagnosis Date  . Anemia   . Arthritis   . Breast cancer ([facility] 2017   Left breast cancer  . Cancer ([facility] 2005   COLON CA  . Colon cancer ([facility]   . Dysrhythmia    PALPITATIONS  . Endometriosis   . Family history of adverse reaction to anesthesia    MOM-NAUSEATED  . Fibroids   . Heart murmur   . Hepatitis B    AGE 56  . Hepatitis C 1999   NO CIRRHOSIS  . Personal history of radiation therapy 2017   Left breast  . Seasonal allergies     Past Surgical History: Past Surgical History:  Procedure Laterality  Date  . ABDOMINAL HYSTERECTOMY     pt states hemorrhage during hysterectomy but had never had any problems with prior surgery  . BREAST BIOPSY Left 08/09/2015   2 areas path pending  . COLONOSCOPY     MULTIPLE  . EXCISION OF BREAST LESION Left 09/04/2015   Procedure: EXCISION OF SKIN LESION @ AREOLA LEFT BREAST;  Surgeon: Leonie Green, MD;  Location: [facility] ORS;  Service: General;  Laterality: Left;  Marland Kitchen MASTECTOMY W/ SENTINEL NODE BIOPSY Left  09/04/2015   Procedure: MASTECTOMY WITH SENTINEL LYMPH NODE BIOPSY;  Surgeon: Leonie Green, MD;  Location: [facility] ORS;  Service: General;  Laterality: Left;  . PARTIAL MASTECTOMY WITH NEEDLE LOCALIZATION Left 09/04/2015   Procedure: PARTIAL MASTECTOMY WITH PREOP XRAY NEEDLE LOCALIZATION;  Surgeon: Leonie Green, MD;  Location: [facility] ORS;  Service: General;  Laterality: Left;  . TUBAL LIGATION      Past Gynecologic History:  Menarche: age 67 Regular menses Hx of tubal ligation G2P2- vaginal delivery History of abnormal pap- yes per hpi History of STDs: denies (hx of hepatitis b and c)  OB History:  OB History  No data available    Family History: Family History  Problem Relation Age of Onset  . Liver cancer Mother   . Colon cancer Maternal Grandmother   . Breast cancer Cousin     Social History: Social History   Socioeconomic History  . Marital status: Married    Spouse name: Not on file  . Number of children: Not on file  . Years of education: Not on file  . Highest education level: Not on file  Occupational History  . Not on file  Social Needs  . Financial resource strain: Not on file  . Food insecurity:    Worry: Not on file    Inability: Not on file  . Transportation needs:    Medical: Not on file    Non-medical: Not on file  Tobacco Use  . Smoking status: Former Smoker    Packs/day: 0.50    Years: 35.00    Pack years: 17.50    Types: E-cigarettes  . Smokeless tobacco: Former Systems developer    Quit date: 08/19/2012  . Tobacco comment: VAPOR CIG   Substance and Sexual Activity  . Alcohol use: No  . Drug use: No  . Sexual activity: Yes  Lifestyle  . Physical activity:    Days per week: Not on file    Minutes per session: Not on file  . Stress: Not on file  Relationships  . Social connections:    Talks on phone: Not on file    Gets together: Not on file    Attends religious service: Not on file    Active member of club or organization: Not on file     Attends meetings of clubs or organizations: Not on file    Relationship status: Not on file  . Intimate partner violence:    Fear of current or ex partner: Not on file    Emotionally abused: Not on file    Physically abused: Not on file    Forced sexual activity: Not on file  Other Topics Concern  . Not on file  Social History Narrative  . Not on file    Allergies: Allergies  Allergen Reactions  . Other Other (See Comments)    DARVOCET    Current Medications: No current facility-administered medications for this visit.    No current outpatient medications on file.  Facility-Administered Medications Ordered in Other Visits  Medication Dose Route Frequency Provider Last Rate Last Dose  . 0.9 %  sodium chloride infusion   Intravenous Continuous Verlon Au, NP      . diphenhydrAMINE (BENADRYL) injection 12.5 mg  12.5 mg Intravenous Q6H PRN Verlon Au, NP       Or  . diphenhydrAMINE (BENADRYL) 12.5 MG/5ML elixir 12.5 mg  12.5 mg Oral Q6H PRN Verlon Au, NP      . morphine 2 mg/mL PCA injection   Intravenous Q4H Benjaman Kindler, MD      . naloxone [facility]) injection 0.4 mg  0.4 mg Intravenous PRN Verlon Au, NP       And  . sodium chloride flush (NS) 0.9 % injection 9 mL  9 mL Intravenous PRN Verlon Au, NP      . ondansetron (ZOFRAN) tablet 4 mg  4 mg Oral Q6H PRN Verlon Au, NP       Or  . ondansetron (ZOFRAN) injection 4 mg  4 mg Intravenous Q6H PRN Verlon Au, NP        Review of Systems General:  Weight gain; hot flashes w/ arimidex Skin: no complaints Eyes: no complaints HEENT: Right ear 'plugged', hoarsness, possible URI Breasts: no complaints Pulmonary: no complaints Cardiac: no complaints Gastrointestinal: intractable nausea and vomiting, RLQ abdominal pain sharp 5/10 w/ oxycodone; not tolerating orals. Last BM this past Monday.  Genitourinary/Sexual: no complaints Ob/Gyn: no complaints Musculoskeletal: no  complaints Hematology: no complaints Neurologic/Psych: no complaints   Objective:  Physical Examination:  BP 124/79   Pulse 72   Temp 97.9 F (36.6 C) (Oral)   Resp 18   Ht 5' 3" (1.6 m)   Wt 194 lb 1.6 oz (88 kg)   BMI 34.38 kg/m     ECOG Performance Status: 3 - Symptomatic, >50% confined to bed  GENERAL: fatigued, ill appearing female. In pajamas. Accompanied by husband.  HEENT:  Sclerae anicteric.  Oropharynx clear and moist. No ulcerations or evidence of oropharyngeal candidiasis. Neck is supple.  NODES:  No cervical, supraclavicular, or axillary lymphadenopathy palpated.  LUNGS:  Clear to auscultation bilaterally. Wheezes at bilateral apices HEART:  Regular rate and rhythm. No murmur appreciated. ABDOMEN:  Soft. RLQ tender to palpation.  No rebound or guarding. Not distended. No palpable masses, ascites or hepatospenomegaly. MSK:  No focal spinal tenderness to palpation. Full range of motion bilaterally in the upper extremities. EXTREMITIES:  No peripheral edema.   SKIN:  Clear with no obvious rashes or skin changes. No nail dyscrasia. NEURO:  Nonfocal. Well oriented.  Appropriate affect.  Pelvic: Chaperoned by NP EGBUS: no lesions Vagina: no discharge or bleeding; excoriation at the top of the vaginal cuff but no other gross lesions Cervix: surgically absent Uterus: surgically absent Adnexa: palpable mobile mass RLQ 8 cm and tender to palpation. Left adnexa negative for masses.  Rectovaginal: confirmatory; no involvement with the rectum and the mass.   Lab Review Labs on site today: CBC, CMP, CA-125  Radiologic Imaging: No imaging on site today    Assessment:  Ayleah B Millikan is a 67 y.o. female diagnosed with complex right adnexal mass with symptoms concerning for torsion given intractable pain. Differential diagnosis includes metastatic breast cancer (unlikely), ovarian malignancy, or other benign ovarian masses (ie dermoid given calcification noted on imaging).    Medical co-morbidities complicating care: h/o breast cancer; prior laparotomies; Hepatitis B and C; Body mass index is 34.38 kg/m.  Plan:   Problem List Items Addressed This Visit    None    Visit Diagnoses    Adnexal mass    -  Primary   Relevant Orders   CA 125   CBC with Differential/Platelet (Completed)   Comprehensive metabolic panel (Completed)   Type and screen      We discussed options and given intractable pain with inability to tolerate oral intake I recommended hospital admission for pain control, IV fluids, anti-emetics, and surgery with bilateral salpingo-oophorectomy. I do not feel she can wait until next week for surgery based on her current symptoms. We can arrange for direct admission and Dr. Leonides Schanz and her team can do her surgery tomorrow.   I reviewed the differential diagnosis with the patient and her husband and provided information on pelvic mass and bilateral salpingo-oophorectomy.  Return to clinic if she has gynecologic malignancy.   The patient's diagnosis, an outline of the further diagnostic and laboratory studies which will be required, the recommendation for surgery, and alternatives were discussed with her and her accompanying family members.  All questions were answered to their satisfaction.  A total of 80 minutes were spent with the patient/family today; >50% was spent in education, counseling and coordination of care for pelvic mass.   Beckey Rutter, DNP, AGNP-C [place] [facility] [phone] (work cell) [phone] (office)  I personally had a face to face interaction and evaluated the patient jointly with the NP, Ms. Beckey Rutter.  I have reviewed her history and available records and have performed the key portions of the physical exam including lymph node survey, abdominal exam, pelvic exam with my findings confirming those documented above by the APP.  I have discussed the case with the APP and the patient.  I agree with the  above documentation, assessment and plan which was fully formulated by me.  Counseling was completed by me.   I personally saw the patient and performed a substantive portion of this encounter in conjunction with the listed APP as documented above.  Indiana Pechacek Gaetana Michaelis, MD  CC:  Dr. Grayland Ormond

## 2018-05-05 NOTE — Patient Instructions (Signed)
Pelvic Mass A pelvic mass is an abnormal growth in the pelvis. The pelvis is the area between your hip bones. It includes the bladder and the rectum in males and females, and also the uterus and ovaries in females. What are the causes? Many things can cause a pelvic mass, including:  Cancer.  Fibroids of the uterus.  Ovarian cysts.  Infection.  Ectopic pregnancy.  What are the signs or symptoms? Symptoms of a pelvic mass may include:  Cramping.  Nausea.  Diarrhea.  Fever.  Vomiting.  Weakness.  Pain in the pelvis, side, or back.  Weight loss.  Constipation.  Problems with vaginal bleeding, including: ? Light or heavy bleeding with or without blood clots. ? Irregular menstruation. ? Pain with menstruation.  Problems with urination, including: ? Frequent urination. ? Inability to empty the bladder completely. ? Urinating very small amounts. ? Pain with urination. ? Bloody urine.  Some pelvic masses do not cause symptoms. How is this diagnosed? To make a diagnosis, your health care provider will need to learn more about the mass. You may have tests or procedures done, such as:  Blood tests.  X-rays.  Ultrasound.  CT scan.  MRI.  A surgery to look inside of your abdomen with cameras (laparoscopy).  A biopsy that is performed with a needle or during laparoscopy or surgery.  In some cases, what seemed like a pelvic mass may actually be something else, such as a mass in one of the organs that are near the pelvis, an infection (abscess) or scar tissue (adhesions) that formed after a surgery. How is this treated? Treatment will depend on the cause of the mass. Follow these instructions at home: What you need to do at home will depend on the cause of the mass. Follow the instructions that your health care provider gives to you. In general:  Keep all follow-up visits as directed by your health care provider. This is important.  Take medicines only as  directed by your health care provider.  Follow any restrictions that are given to you by your health care provider.  Contact a health care provider if:  You develop new symptoms. Get help right away if:  You vomit bright red blood or vomit material that looks like coffee grounds.  You have blood in your stools, or the stools turn black and tarry.  You have an abnormal or increased amount of vaginal bleeding.  You have a fever.  You develop easy bruising or bleeding.  You develop sudden or worsening pain that is not controlled by your medicine.  You feel worsening weakness, or you have a fainting episode.  You feel that the mass has suddenly gotten larger.  You develop severe bloating in your abdomen or your pelvis.  You cannot pass any urine.  You are unable to have a bowel movement. This information is not intended to replace advice given to you by your health care provider. Make sure you discuss any questions you have with your health care provider. Document Released: 08/26/2006 Document Revised: 10/25/2015 Document Reviewed: 01/02/2014 Elsevier Interactive Patient Education  2018 [place].   Bilateral Salpingo-Oophorectomy Bilateral salpingo-oophorectomy is the surgical removal of both fallopian tubes and both ovaries. The ovaries are reproductive organs that produce eggs in women. The fallopian tubes allow eggs to move from the ovaries to the uterus. You may need this procedure if you:  Have had your uterus removed. This procedure is usually done after the uterus is removed.  Have cancer  of the fallopian tubes or ovaries.  Have a high risk of cancer of the fallopian tubes or ovaries.  There are three different techniques that can be used for this procedure:  Open. One large incision will be made in your abdomen.  Laparoscopic. A thin, lighted tube with a small camera on the end (laparoscope) will be used to help perform the procedure. The laparoscope will  allow your surgeon to make several small incisions in the abdomen instead of one large incision.  Robot-assisted. A computer will be used to control surgical instruments that are attached to robotic arms. A laparoscope may also be used with this technique.  As a result of this procedure, you will become sterile (unable to become pregnant), and you will go into menopause (no longer able to have menstrual periods). You may develop symptoms of menopause such as hot flashes, night sweats, and mood changes. Your sex drive may also be affected. Tell a health care provider about:  Any allergies you have.  All medicines you are taking, including vitamins, herbs, eye drops, creams, and over-the-counter medicines.  Any problems you or family members have had with anesthetic medicines.  Any blood disorders you have.  Any surgeries you have had.  Any medical conditions you have.  Whether you are pregnant or may be pregnant. What are the risks? Generally, this is a safe procedure. However, problems may occur, including:  Infection.  Bleeding.  Allergic reactions to medicines.  Damage to other structures or organs.  Blood clots in the legs or lungs.  What happens before the procedure? Staying hydrated Follow instructions from your health care provider about hydration, which may include:  Up to 2 hours before the procedure - you may continue to drink clear liquids, such as water, clear fruit juice, black coffee, and plain tea.  Eating and drinking restrictions Follow instructions from your health care provider about eating and drinking, which may include:  8 hours before the procedure - stop eating heavy meals or foods such as meat, fried foods, or fatty foods.  6 hours before the procedure - stop eating light meals or foods, such as toast or cereal.  6 hours before the procedure - stop drinking milk or drinks that contain milk.  2 hours before the procedure - stop drinking clear  liquids.  Medicines  Ask your health care provider about: ? Changing or stopping your regular medicines. This is especially important if you are taking diabetes medicines or blood thinners. ? Taking medicines such as aspirin and ibuprofen. These medicines can thin your blood. Do not take these medicines before your procedure if your health care provider instructs you not to.  You may be given antibiotic medicine to help prevent infection. General instructions  Do not smoke for at least 2 weeks before your procedure or as told by your health care provider.  You may have an exam or testing.  You may have a blood or urine sample taken.  Ask your health care provider how your surgical site will be marked or identified.  Plan to have someone take you home from the hospital.  If you will be going home right after the procedure, plan to have someone with you for 24 hours. What happens during the procedure?  To reduce your risk of infection: ? Your health care team will wash or sanitize their hands. ? Your skin will be washed with soap. ? Hair may be removed from the surgical area.  An IV tube will be  inserted into one of your veins.  You will be given one or more of the following: ? A medicine to help you relax (sedative). ? A medicine to make you fall asleep (general anesthetic).  A thin tube (catheter) will be inserted through your urethra and into your bladder. The catheter drains urine during your procedure.  Depending on the type of surgery you are having, your surgeon will do one of the following: ? Make one incision in your abdomen (open surgery). ? Make two small incisions in your abdomen (laparoscopic surgery). The laparoscope will be passed through one incision, and surgical instruments will be passed through the other. ? Make several small incisions in your abdomen (robot-assisted surgery). A laparoscope and other surgical instruments may be passed through the  incisions.  Your fallopian tubes and ovaries will be cut away from the uterus and removed.  Your blood vessels will be clamped and tied to prevent too much bleeding.  The incision(s) in your abdomen will be closed with stitches (sutures) or staples.  A bandage (dressing) may be placed over your incision(s). The procedure may vary among health care providers and hospitals. What happens after the procedure?  Your blood pressure, heart rate, breathing rate, and blood oxygen level will be monitored until the medicines you were given have worn off.  You may continue to receive fluids and medicines through an IV tube.  You may continue to have a catheter draining your urine.  You may have to wear compression stockings. These stockings help to prevent blood clots and reduce swelling in your legs.  You will be given pain medicine as needed.  Do not drive for 24 hours if you received a sedative. Summary  Bilateral salpingo-oophorectomy is a procedure to remove both fallopian tubes and both ovaries.  There are three different techniques that can be used for this procedure, including open, laparoscopic, and robotic. Talk with your health care provider about how your procedure will be done.  As a result of this procedure, you will become sterile and you will go into menopause.  Plan to have someone take you home from the hospital. This information is not intended to replace advice given to you by your health care provider. Make sure you discuss any questions you have with your health care provider. Document Released: 05/19/2005 Document Revised: 06/23/2016 Document Reviewed: 06/23/2016 Elsevier Interactive Patient Education  [employer].

## 2018-05-05 NOTE — Progress Notes (Deleted)
Gynecologic Oncology Consult Visit   Referring Provider: ***  Chief Concern: ***  Subjective:  Debra Joseph is a 67 y.o. female who is seen in consultation from Dr. Edwina Barth for ***.   Problem List: Patient Active Problem List   Diagnosis Date Noted  . Breast cancer of upper-outer quadrant of left female breast ([place]) 09/02/2015    Past Medical History: Past Medical History:  Diagnosis Date  . Anemia   . Arthritis   . Breast cancer ([place]) 2017   Left breast cancer  . Cancer ([place]) 2005   COLON CA  . Colon cancer ([place])   . Dysrhythmia    PALPITATIONS  . Endometriosis   . Family history of adverse reaction to anesthesia    MOM-NAUSEATED  . Fibroids   . Heart murmur   . Hepatitis B    AGE 43  . Hepatitis C 1999   NO CIRRHOSIS  . Personal history of radiation therapy 2017   Left breast  . Seasonal allergies     Past Surgical History: Past Surgical History:  Procedure Laterality Date  . ABDOMINAL HYSTERECTOMY     pt states hemorrhage during hysterectomy but had never had any problems with prior surgery  . BREAST BIOPSY Left 08/09/2015   2 areas path pending  . COLONOSCOPY     MULTIPLE  . EXCISION OF BREAST LESION Left 09/04/2015   Procedure: EXCISION OF SKIN LESION @ AREOLA LEFT BREAST;  Surgeon: Leonie Green, MD;  Location: [facility] ORS;  Service: General;  Laterality: Left;  Marland Kitchen MASTECTOMY W/ SENTINEL NODE BIOPSY Left 09/04/2015   Procedure: MASTECTOMY WITH SENTINEL LYMPH NODE BIOPSY;  Surgeon: Leonie Green, MD;  Location: [facility] ORS;  Service: General;  Laterality: Left;  . PARTIAL MASTECTOMY WITH NEEDLE LOCALIZATION Left 09/04/2015   Procedure: PARTIAL MASTECTOMY WITH PREOP XRAY NEEDLE LOCALIZATION;  Surgeon: Leonie Green, MD;  Location: [facility] ORS;  Service: General;  Laterality: Left;  . TUBAL LIGATION      Past Gynecologic History:  Menarche: {NUMBERS 0-12:18577} Menstrual details: Lasts {NUMBERS 0-12:18577} days Menses regular: {YES NO:22349} Last  Menstrual Period: *** History of OCP/HRT use: *** History of Abnormal pap: {YES NO:22349}, {Findings; lab pap smear results:16707::"no abnormalities"} Last pap: {Blank multiple:19196} History of STDs: {STD history:20597} Contraception: {PLAN CONTRACEPTION:313102} Sexually active: {Responses; yes/no/not asked:9010}  OB History:  OB History  No data available    Family History: Family History  Problem Relation Age of Onset  . Liver cancer Mother   . Colon cancer Maternal Grandmother   . Breast cancer Cousin     Social History: Social History   Socioeconomic History  . Marital status: Married    Spouse name: Not on file  . Number of children: Not on file  . Years of education: Not on file  . Highest education level: Not on file  Occupational History  . Not on file  Social Needs  . Financial resource strain: Not on file  . Food insecurity:    Worry: Not on file    Inability: Not on file  . Transportation needs:    Medical: Not on file    Non-medical: Not on file  Tobacco Use  . Smoking status: Former Smoker    Packs/day: 0.50    Years: 35.00    Pack years: 17.50    Types: E-cigarettes  . Smokeless tobacco: Former Systems developer    Quit date: 08/19/2012  . Tobacco comment: VAPOR CIG   Substance and Sexual Activity  . Alcohol  use: No  . Drug use: No  . Sexual activity: Yes  Lifestyle  . Physical activity:    Days per week: Not on file    Minutes per session: Not on file  . Stress: Not on file  Relationships  . Social connections:    Talks on phone: Not on file    Gets together: Not on file    Attends religious service: Not on file    Active member of club or organization: Not on file    Attends meetings of clubs or organizations: Not on file    Relationship status: Not on file  . Intimate partner violence:    Fear of current or ex partner: Not on file    Emotionally abused: Not on file    Physically abused: Not on file    Forced sexual activity: Not on file   Other Topics Concern  . Not on file  Social History Narrative  . Not on file    Allergies: Allergies  Allergen Reactions  . Other Other (See Comments)    DARVOCET    Current Medications: Current Outpatient Medications  Medication Sig Dispense Refill  . acetaminophen (TYLENOL) 500 MG tablet Take 1,000 mg by mouth every 6 (six) hours as needed.     Marland Kitchen anastrozole (ARIMIDEX) 1 MG tablet TAKE 1 TABLET(1 MG) BY MOUTH DAILY 90 tablet 3  . diphenhydrAMINE (BENADRYL) 25 MG tablet Take 25 mg by mouth every 6 (six) hours as needed.    . ondansetron (ZOFRAN) 4 MG tablet Take 1 tablet (4 mg total) by mouth daily as needed. 10 tablet 0  . oxyCODONE-acetaminophen (PERCOCET) 5-325 MG tablet Take 1 tablet by mouth every 4 (four) hours as needed. 12 tablet 0  . amoxicillin (AMOXIL) 875 MG tablet Take 1 tablet (875 mg total) by mouth 2 (two) times daily for 10 days. (Patient not taking: Reported on 05/05/2018) 20 tablet 0  . Multiple Vitamins-Minerals (CENTRUM ULTRA WOMENS PO) Take 1 tablet by mouth daily.     . naproxen sodium (RA NAPROXEN SODIUM) 220 MG tablet Take 220 mg by mouth daily as needed.      No current facility-administered medications for this visit.     Review of Systems General: {Findings; ROS constitutional:30497::"negative for","fevers","chills","fatigue","changes in sleep","changes in weight or appetite"} Skin: {ros; skin:310673::"negative for","changes in color, texture, moles or lesions"} Eyes: {Findings; ROS eyes:30500::"negative for","changes in vision","pain","diplopia"} HEENT: {Findings; ROS HEENT:30502::"negative for","change in hearing","pain","discharge","tinnitus","vertigo","voice changes","sore throat","neck masses"} Breasts: {ros breast:311036} Pulmonary: {Findings; ROS respiratory:30504::"negative for","dyspnea","orthopnea","productive cough"} Cardiac: {Findings; ROS cardiac:30506::"negative  for","palpitations","syncope","pain","discomfort","pressure"} Gastrointestinal: {Findings; ROS gastrointestinal:30513::"negative for","dysphagia","nausea","vomiting","jaundice","pain","constipation","diarrhea","hematemesis","hematochezia"} Genitourinary/Sexual: {Findings; ROS genitourinary:30516::"negative for","dysuria","discharge","hesitancy","nocturia","retention","stones","infections","STD's","incontinence"} Ob/Gyn: {Gyn ros:5267::"negative for","irregular bleeding","pain"} Musculoskeletal: {Findings; ROS musculoskeletal:30524::"negative for","pain","stiffness","swelling","range of motion limitation"} Hematology: {Heme ros:13436::"negative for","easy bruising","bleeding"} Neurologic/Psych: {Findings; ROS neuro:30532::"negative for","headaches","seizures","paralysis","weakness","tremor","change in gait","change in sensation","mood swings","depression","anxiety","change in memory"}  Objective:  Physical Examination:  BP 124/79   Pulse 72   Temp 97.9 F (36.6 C) (Oral)   Resp 18   Ht 5\' 3"  (1.6 m)   Wt 194 lb 1.6 oz (88 kg)   BMI 34.38 kg/m    ECOG Performance Status: {DESC; ECOG PERFORMANCE STATUS (NQF 385):19948:p}  General appearance: {Exam; general:16600::"alert","cooperative","appears stated age"} HEENT:{exam; WUJWJ:19147} Lymph node survey: {pe lymph nodes:310023::"axillary","inguinal","supraclavicular","non-palpable"} Cardiovascular: {HEART EXAM HEM/ONC:21750} Respiratory: {Auscultation Lung:20254} Breast exam: {pe breast exam:315056::"breasts appear normal, no suspicious masses, no skin or nipple changes or axillary nodes"}. Abdomen: {Exam; abdomen:14935::"no hernias","well healed incision"} Back: {back exam:311380::"inspection of back is normal"} Extremities: {Exam; extremity:5109} Skin exam - {skin exam:315960::"normal coloration and turgor, no rashes, no  suspicious skin lesions noted"}. Neurological exam reveals {BUYZJ:096438::"VKFMM, oriented, normal speech, no focal  findings or movement disorder noted"}.  Pelvic: {Pelvic exam:5055::"exam chaperoned by nurse"};  Vulva: {external genitalia female:315901:p:"normal appearing vulva with no masses, tenderness or lesions"}; Vagina: {exam; vagina:12200:p}; Adnexa: {CRFVOH:606770:H:"EKBTCY adnexa in size, nontender and no masses"}; Uterus: {uterus:315905:p:"uterus is normal size, shape, consistency and nontender"}; Cervix: {exam; cervix:14595}; Rectal: {rectal female:311646::"not indicated"}    Lab Review Labs on site today: ***  Radiologic Imaging: ***    Assessment:  USHA SLAGER is a 67 y.o. female diagnosed with {insert grade of cancer if applicable}  {Blank ELYHTM:93112::"TKKOECX cancer","cervical cancer","endometrial cancer","pelvic mass","dysplasia","vulvar cancer","***"}. Medical co-morbidities complicating care: {Blank multiple:19196::"HTN","diabetes","obesity","prior abdominal surgery","immunocompromised","***"}.  Plan:   Problem List Items Addressed This Visit    None    Visit Diagnoses    Adnexal mass    -  Primary   Relevant Orders   CA 125   CBC with Differential/Platelet   Comprehensive metabolic panel   Type and screen      We discussed options for management including ***. Based on *** , we recommend {Blank single:19197::"definitive surgical evaluation","neoadjuvant chemotherapy","radiation","concurrent chemotherapy and radiation","further testing","***"}.  {Insert risk statement for surgery if preop, .gynoncsurrisk} Suggested return to clinic in  {1-10:18281} {units:10146}.    The patient's diagnosis, an outline of the further diagnostic and laboratory studies which will be required, the recommendation, and alternatives were discussed.  All questions were answered to the patient's satisfaction.  A total of *** minutes were spent with the patient/family today; ***% was spent in education, counseling and coordination of care for {Blank single:19197::"ovarian cancer","cervical  cancer","endometrial cancer","pelvic mass","dysplasia","vulvar cancer","***"}.    Gillis Ends, MD    CC:  Baxter Hire, MD [address] [place], [place] [address] [phone]

## 2018-05-06 ENCOUNTER — Inpatient Hospital Stay
Admission: AD | Admit: 2018-05-06 | Discharge: 2018-05-06 | Disposition: A | Discharge status: Home / Self Care | Payer: Medicare Other | Source: Ambulatory Visit | Attending: Specialist | Admitting: Specialist

## 2018-05-06 ENCOUNTER — Encounter: Payer: Self-pay | Admitting: Anesthesiology

## 2018-05-06 ENCOUNTER — Ambulatory Visit: Admit: 2018-05-06 | Payer: Medicare Other | Admitting: Obstetrics & Gynecology

## 2018-05-06 LAB — ECHOCARDIOGRAM COMPLETE
Height: 63 in
Weight: 3105.59 oz

## 2018-05-06 LAB — CA 125: Cancer Antigen (CA) 125: 8.9 U/mL (ref 0.0–38.1)

## 2018-05-06 MED ORDER — IPRATROPIUM-ALBUTEROL 0.5-2.5 (3) MG/3ML IN SOLN
3.0000 mL | Freq: Four times a day (QID) | RESPIRATORY_TRACT | Status: DC
  Administered 2018-05-07 – 2018-05-08 (×5): 3 mL via RESPIRATORY_TRACT
  Filled 2018-05-06 (×2): qty 3

## 2018-05-06 MED ORDER — FENTANYL CITRATE (PF) 100 MCG/2ML IJ SOLN
INTRAMUSCULAR | Status: AC
  Filled 2018-05-06: qty 2

## 2018-05-06 MED ORDER — LIDOCAINE HCL (PF) 2 % IJ SOLN
INTRAMUSCULAR | Status: AC
  Filled 2018-05-06: qty 10

## 2018-05-06 MED ORDER — IPRATROPIUM-ALBUTEROL 0.5-2.5 (3) MG/3ML IN SOLN
3.0000 mL | Freq: Four times a day (QID) | RESPIRATORY_TRACT | Status: DC
  Administered 2018-05-06 (×3): 3 mL via RESPIRATORY_TRACT
  Filled 2018-05-06 (×3): qty 3

## 2018-05-06 MED ORDER — PNEUMOCOCCAL VAC POLYVALENT 25 MCG/0.5ML IJ INJ
0.5000 mL | INJECTION | INTRAMUSCULAR | Status: DC
  Filled 2018-05-06: qty 0.5

## 2018-05-06 MED ORDER — ROCURONIUM BROMIDE 50 MG/5ML IV SOLN
INTRAVENOUS | Status: AC
  Filled 2018-05-06: qty 1

## 2018-05-06 MED ORDER — SUCCINYLCHOLINE CHLORIDE 20 MG/ML IJ SOLN
INTRAMUSCULAR | Status: AC
  Filled 2018-05-06: qty 1

## 2018-05-06 MED ORDER — PROPOFOL 10 MG/ML IV BOLUS
INTRAVENOUS | Status: AC
  Filled 2018-05-06: qty 20

## 2018-05-06 MED ORDER — BUDESONIDE 0.5 MG/2ML IN SUSP
0.5000 mg | Freq: Two times a day (BID) | RESPIRATORY_TRACT | Status: DC
  Administered 2018-05-06 – 2018-05-08 (×4): 0.5 mg via RESPIRATORY_TRACT
  Filled 2018-05-06 (×9): qty 2

## 2018-05-06 MED ORDER — HYDROMORPHONE HCL 1 MG/ML IJ SOLN
0.2000 mg | INTRAMUSCULAR | Status: DC | PRN
  Administered 2018-05-06 – 2018-05-07 (×11): 0.2 mg via INTRAVENOUS
  Filled 2018-05-06 (×8): qty 1

## 2018-05-06 MED ORDER — MIDAZOLAM HCL 2 MG/2ML IJ SOLN
INTRAMUSCULAR | Status: AC
  Filled 2018-05-06: qty 2

## 2018-05-06 NOTE — Progress Notes (Signed)
Patient unable to go to surgery. Patient back to unit at this time.     Hilbert Bible, RN

## 2018-05-06 NOTE — Progress Notes (Signed)
Case cancelled by anesthesia due to "not enough time" to get the case done per the computer.   Cardiac clearance approved by Dr. Saralyn Pilar after reading ECHO and result: normal. Medical clearance approved pending cardiac clearance  My hands are tied.  I am ready and available for surgery.   ----- Larey Days, MD Attending Obstetrician and Gynecologist [facility], Department of [facility]

## 2018-05-06 NOTE — Anesthesia Preprocedure Evaluation (Addendum)
Anesthesia Evaluation  Patient identified by MRN, date of birth, ID band Patient awake    Reviewed: Allergy & Precautions, NPO status , Patient's Chart, lab work & pertinent test results  History of Anesthesia Complications (+) Family history of anesthesia reactionNegative for: history of anesthetic complications (mother with PONV)  Airway Mallampati: II       Dental   Pulmonary former smoker,           Cardiovascular negative cardio ROS  + dysrhythmias (skip, no tx) (-) Valvular Problems/Murmurs     Neuro/Psych negative neurological ROS     GI/Hepatic negative GI ROS, (+) Hepatitis -, C, B  Endo/Other  negative endocrine ROS  Renal/GU negative Renal ROS     Musculoskeletal  (+) Arthritis , Osteoarthritis,    Abdominal   Peds  Hematology  (+) anemia ,   Anesthesia Other Findings   Reproductive/Obstetrics                             Anesthesia Physical  Anesthesia Plan  ASA: II  Anesthesia Plan: General   Post-op Pain Management:    Induction: Intravenous  PONV Risk Score and Plan:   Airway Management Planned: Oral ETT  Additional Equipment:   Intra-op Plan:   Post-operative Plan: Extubation in OR  Informed Consent: I have reviewed the patients History and Physical, chart, labs and discussed the procedure including the risks, benefits and alternatives for the proposed anesthesia with the patient or authorized representative who has indicated his/her understanding and acceptance.     Plan Discussed with:   Anesthesia Plan Comments: (Spoke with the surgeon and she states that this case is urgent despite URI and needs to be done .  We will have IM evaluate the patient prior to surgery.)       Anesthesia Quick Evaluation

## 2018-05-06 NOTE — Progress Notes (Addendum)
Preoperative History and Physical  Debra Joseph is a 67 y.o.  Admitted yesterday for acute pain unresolved with PO meds, and awaiting surgery today due to scheduling and availability yesterday.  Was seen in ED on 12/3 for acute onset RLQ pain and discharged, at follow up visit on 12/4 it was determined that she needed surgery ASAP.  She was admitted overnight with a PCA for pain control.    She did not sleep well due to the nasal cannula and bronchitis overnight.  She was seen by anesthesia this morning who requested hospitalist evaluation  Proposed surgery: laparoscopic BSO, possible mini-laparotomy  Past Medical History:  Diagnosis Date  . Anemia   . Arthritis   . Breast cancer ([place]) 2017   Left breast cancer  . Cancer ([place]) 2005   COLON CA  . Colon cancer ([place])   . Dysrhythmia    PALPITATIONS  . Endometriosis   . Family history of adverse reaction to anesthesia    MOM-NAUSEATED  . Fibroids   . Heart murmur   . Hepatitis B    AGE 55  . Hepatitis C 1999   NO CIRRHOSIS  . Personal history of radiation therapy 2017   Left breast  . Seasonal allergies    Past Surgical History:  Procedure Laterality Date  . ABDOMINAL HYSTERECTOMY     pt states hemorrhage during hysterectomy but had never had any problems with prior surgery  . BREAST BIOPSY Left 08/09/2015   2 areas path pending  . COLONOSCOPY     MULTIPLE  . EXCISION OF BREAST LESION Left 09/04/2015   Procedure: EXCISION OF SKIN LESION @ AREOLA LEFT BREAST;  Surgeon: Leonie Green, MD;  Location: [facility] ORS;  Service: General;  Laterality: Left;  Marland Kitchen MASTECTOMY W/ SENTINEL NODE BIOPSY Left 09/04/2015   Procedure: MASTECTOMY WITH SENTINEL LYMPH NODE BIOPSY;  Surgeon: Leonie Green, MD;  Location: [facility] ORS;  Service: General;  Laterality: Left;  . PARTIAL MASTECTOMY WITH NEEDLE LOCALIZATION Left 09/04/2015   Procedure: PARTIAL MASTECTOMY WITH PREOP XRAY NEEDLE LOCALIZATION;  Surgeon: Leonie Green, MD;  Location: [facility]  ORS;  Service: General;  Laterality: Left;  . TUBAL LIGATION     OB History  No data available  Patient denies any other pertinent gynecologic issues.   No current facility-administered medications on file prior to encounter.    Current Outpatient Medications on File Prior to Encounter  Medication Sig Dispense Refill  . acetaminophen (TYLENOL) 500 MG tablet Take 1,000 mg by mouth every 6 (six) hours as needed.     Marland Kitchen anastrozole (ARIMIDEX) 1 MG tablet TAKE 1 TABLET(1 MG) BY MOUTH DAILY 90 tablet 3  . amoxicillin (AMOXIL) 875 MG tablet Take 1 tablet (875 mg total) by mouth 2 (two) times daily for 10 days. 20 tablet 0  . diphenhydrAMINE (BENADRYL) 25 MG tablet Take 25 mg by mouth every 6 (six) hours as needed.    . Multiple Vitamins-Minerals (CENTRUM ULTRA WOMENS PO) Take 1 tablet by mouth daily.     . naproxen sodium (RA NAPROXEN SODIUM) 220 MG tablet Take 220 mg by mouth daily as needed.     . ondansetron (ZOFRAN) 4 MG tablet Take 1 tablet (4 mg total) by mouth daily as needed. 10 tablet 0  . oxyCODONE-acetaminophen (PERCOCET) 5-325 MG tablet Take 1 tablet by mouth every 4 (four) hours as needed. 12 tablet 0   Allergies  Allergen Reactions  . Other Other (See Comments)    DARVOCET  Social History:   reports that she has quit smoking. Her smoking use included e-cigarettes. She has a 17.50 pack-year smoking history. She quit smokeless tobacco use about 5 years ago. She reports that she does not drink alcohol or use drugs.  Family History  Problem Relation Age of Onset  . Liver cancer Mother   . Colon cancer Maternal Grandmother   . Breast cancer Cousin     Review of Systems: Noncontributory  PHYSICAL EXAM: Blood pressure 105/63, pulse 90, temperature 98.9 F (37.2 C), temperature source Oral, resp. rate 16, height 5\' 3"  (1.6 m), weight 88 kg, SpO2 98 %. General appearance - alert, well appearing, and in no distress Chest - clear to auscultation, expiratory wheezes, no rales or  rhonchi, symmetric air entry Heart - normal rate and regular rhythm Abdomen - soft, nontender, nondistended, no masses or organomegaly Pelvic - examination not indicated Extremities - peripheral pulses normal, no pedal edema, no clubbing or cyanosis  Labs: Results for orders placed or performed in visit on 05/05/18 (from the past 336 hour(s))  Type and screen   Collection Time: 05/05/18  1:12 PM  Result Value Ref Range   ABO/RH(D) A NEG    Antibody Screen NEG    Sample Expiration      05/08/2018 Performed at [facility], [address] [place], [place] [address]   Comprehensive metabolic panel   Collection Time: 05/05/18  1:33 PM  Result Value Ref Range   Sodium 136 135 - 145 mmol/L   Potassium 3.4 (L) 3.5 - 5.1 mmol/L   Chloride 100 98 - 111 mmol/L   CO2 24 22 - 32 mmol/L   Glucose, Bld 124 (H) 70 - 99 mg/dL   BUN 8 8 - 23 mg/dL   Creatinine, Ser 0.87 0.44 - 1.00 mg/dL   Calcium 9.2 8.9 - 10.3 mg/dL   Total Protein 7.7 6.5 - 8.1 g/dL   Albumin 4.1 3.5 - 5.0 g/dL   AST 36 15 - 41 U/L   ALT 15 0 - 44 U/L   Alkaline Phosphatase 63 38 - 126 U/L   Total Bilirubin 0.8 0.3 - 1.2 mg/dL   GFR calc non Af Amer >60 >60 mL/min   GFR calc Af Amer >60 >60 mL/min   Anion gap 12 5 - 15  CBC with Differential/Platelet   Collection Time: 05/05/18  1:33 PM  Result Value Ref Range   WBC 18.5 (H) 4.0 - 10.5 K/uL   RBC 4.37 3.87 - 5.11 MIL/uL   Hemoglobin 13.4 12.0 - 15.0 g/dL   HCT 40.5 36.0 - 46.0 %   MCV 92.7 80.0 - 100.0 fL   MCH 30.7 26.0 - 34.0 pg   MCHC 33.1 30.0 - 36.0 g/dL   RDW 12.7 11.5 - 15.5 %   Platelets 382 150 - 400 K/uL   nRBC 0.0 0.0 - 0.2 %   Neutrophils Relative % 84 %   Neutro Abs 15.3 (H) 1.7 - 7.7 K/uL   Lymphocytes Relative 10 %   Lymphs Abs 1.9 0.7 - 4.0 K/uL   Monocytes Relative 6 %   Monocytes Absolute 1.2 (H) 0.1 - 1.0 K/uL   Eosinophils Relative 0 %   Eosinophils Absolute 0.0 0.0 - 0.5 K/uL   Basophils Relative 0 %   Basophils Absolute  0.1 0.0 - 0.1 K/uL   Immature Granulocytes 0 %   Abs Immature Granulocytes 0.07 0.00 - 0.07 K/uL  CA 125   Collection Time: 05/05/18  1:33  PM  Result Value Ref Range   Cancer Antigen (CA) 125 8.9 0.0 - 38.1 U/mL  Results for orders placed or performed during the hospital encounter of 05/04/18 (from the past 336 hour(s))  Lipase, blood   Collection Time: 05/04/18  3:56 PM  Result Value Ref Range   Lipase 34 11 - 51 U/L  Comprehensive metabolic panel   Collection Time: 05/04/18  3:56 PM  Result Value Ref Range   Sodium 138 135 - 145 mmol/L   Potassium 3.6 3.5 - 5.1 mmol/L   Chloride 104 98 - 111 mmol/L   CO2 24 22 - 32 mmol/L   Glucose, Bld 142 (H) 70 - 99 mg/dL   BUN 13 8 - 23 mg/dL   Creatinine, Ser 0.89 0.44 - 1.00 mg/dL   Calcium 9.0 8.9 - 10.3 mg/dL   Total Protein 7.9 6.5 - 8.1 g/dL   Albumin 4.3 3.5 - 5.0 g/dL   AST 22 15 - 41 U/L   ALT 15 0 - 44 U/L   Alkaline Phosphatase 71 38 - 126 U/L   Total Bilirubin 0.7 0.3 - 1.2 mg/dL   GFR calc non Af Amer >60 >60 mL/min   GFR calc Af Amer >60 >60 mL/min   Anion gap 10 5 - 15  CBC   Collection Time: 05/04/18  3:56 PM  Result Value Ref Range   WBC 18.7 (H) 4.0 - 10.5 K/uL   RBC 4.61 3.87 - 5.11 MIL/uL   Hemoglobin 14.1 12.0 - 15.0 g/dL   HCT 43.7 36.0 - 46.0 %   MCV 94.8 80.0 - 100.0 fL   MCH 30.6 26.0 - 34.0 pg   MCHC 32.3 30.0 - 36.0 g/dL   RDW 12.7 11.5 - 15.5 %   Platelets 385 150 - 400 K/uL   nRBC 0.0 0.0 - 0.2 %  Urinalysis, Complete w Microscopic   Collection Time: 05/04/18  5:16 PM  Result Value Ref Range   Color, Urine YELLOW (A) YELLOW   APPearance CLEAR (A) CLEAR   Specific Gravity, Urine 1.024 1.005 - 1.030   pH 5.0 5.0 - 8.0   Glucose, UA NEGATIVE NEGATIVE mg/dL   Hgb urine dipstick NEGATIVE NEGATIVE   Bilirubin Urine NEGATIVE NEGATIVE   Ketones, ur 80 (A) NEGATIVE mg/dL   Protein, ur 30 (A) NEGATIVE mg/dL   Nitrite NEGATIVE NEGATIVE   Leukocytes, UA NEGATIVE NEGATIVE   RBC / HPF 11-20 0 - 5 RBC/hpf    WBC, UA 0-5 0 - 5 WBC/hpf   Bacteria, UA NONE SEEN NONE SEEN   Squamous Epithelial / LPF 0-5 0 - 5   Mucus PRESENT    Hyaline Casts, UA PRESENT    Ca Oxalate Crys, UA PRESENT    Non Squamous Epithelial PRESENT (A) NONE SEEN    Imaging Studies: Dg Chest 2 View  Result Date: 05/05/2018 CLINICAL DATA:  Preoperative respiratory evaluation prior to hysterectomy and BILATERAL oophorectomy due to a mass in the RIGHT adnexum. Personal history of LEFT mastectomy for breast cancer. EXAM: CHEST - 2 VIEW COMPARISON:  No prior chest x-ray. Thoracic spine x-rays 06/19/2017 are correlated. FINDINGS: AP ERECT and LATERAL images were obtained. Suboptimal inspiration. Chronic elevation of the LEFT hemidiaphragm (visible on the prior thoracic spine x-rays) with chronic scar/atelectasis involving the LEFT LOWER LOBE and lingula. Lungs otherwise clear. No pulmonary parenchymal nodules. No pleural effusions. Degenerative changes involving the thoracic spine. IMPRESSION: Suboptimal inspiration. Chronic elevation of the LEFT hemidiaphragm with chronic scar/atelectasis involving the LEFT  LOWER LOBE and lingula. No acute cardiopulmonary disease otherwise. Electronically Signed   By: Evangeline Dakin M.D.   On: 05/05/2018 15:56   US Pelvis Transvanginal Non-ob (tv Only)  Result Date: 05/04/2018 CLINICAL DATA:  Initial evaluation for right lower quadrant pain for 1 day, adnexal mass on prior CT. EXAM: TRANSABDOMINAL AND TRANSVAGINAL ULTRASOUND OF PELVIS DOPPLER ULTRASOUND OF OVARIES TECHNIQUE: Both transabdominal and transvaginal ultrasound examinations of the pelvis were performed. Transabdominal technique was performed for global imaging of the pelvis including uterus, ovaries, adnexal regions, and pelvic cul-de-sac. It was necessary to proceed with endovaginal exam following the transabdominal exam to visualize the pelvic structures. Color and duplex Doppler ultrasound was utilized to evaluate blood flow to the ovaries.  COMPARISON:  Prior CT from earlier the same day. FINDINGS: Uterus Status post hysterectomy.  No abnormality about the vaginal cuff. Endometrium Surgically absent. Right ovary No definite native ovarian tissue identified. 7.5 x 7.3 x 7.5 cm heterogeneous lesion present within the right adnexa, corresponding with abnormality seen on prior CT. Lesion demonstrates scattered areas of internal echogenicity with shadowing, likely reflecting calcification. Arterial and venous flow seen within this lesion. Left ovary Not visualized.  No adnexal mass. Pulsed Doppler evaluation of both ovaries demonstrates normal low-resistance arterial and venous waveforms. Other findings No abnormal free fluid. IMPRESSION: 1. 7.5 cm ill-defined shadowing right adnexal mass, corresponding with lesion seen on prior CT. Finding is indeterminate, with differential considerations including a possible solid ovarian neoplasm, although no definite native ovarian tissue identified on this exam. Possible mass arising from the GI tract such is a GIST tumor could also be considered, as this lesion appears to closely approximate several loops of bowel on prior CT. Gynecologic referral for surgical consultation recommended. Additionally, further assessment with dedicated MRI of the pelvis, with and without contrast, suggested for further characterization. 2. Status post hysterectomy with nonvisualization of the uterus and left ovary. No other acute abnormality within the pelvis. No free fluid. Electronically Signed   By: Jeannine Boga M.D.   On: 05/04/2018 19:29   US Pelvis Complete  Result Date: 05/04/2018 CLINICAL DATA:  Initial evaluation for right lower quadrant pain for 1 day, adnexal mass on prior CT. EXAM: TRANSABDOMINAL AND TRANSVAGINAL ULTRASOUND OF PELVIS DOPPLER ULTRASOUND OF OVARIES TECHNIQUE: Both transabdominal and transvaginal ultrasound examinations of the pelvis were performed. Transabdominal technique was performed for global  imaging of the pelvis including uterus, ovaries, adnexal regions, and pelvic cul-de-sac. It was necessary to proceed with endovaginal exam following the transabdominal exam to visualize the pelvic structures. Color and duplex Doppler ultrasound was utilized to evaluate blood flow to the ovaries. COMPARISON:  Prior CT from earlier the same day. FINDINGS: Uterus Status post hysterectomy.  No abnormality about the vaginal cuff. Endometrium Surgically absent. Right ovary No definite native ovarian tissue identified. 7.5 x 7.3 x 7.5 cm heterogeneous lesion present within the right adnexa, corresponding with abnormality seen on prior CT. Lesion demonstrates scattered areas of internal echogenicity with shadowing, likely reflecting calcification. Arterial and venous flow seen within this lesion. Left ovary Not visualized.  No adnexal mass. Pulsed Doppler evaluation of both ovaries demonstrates normal low-resistance arterial and venous waveforms. Other findings No abnormal free fluid. IMPRESSION: 1. 7.5 cm ill-defined shadowing right adnexal mass, corresponding with lesion seen on prior CT. Finding is indeterminate, with differential considerations including a possible solid ovarian neoplasm, although no definite native ovarian tissue identified on this exam. Possible mass arising from the GI tract such is a GIST  tumor could also be considered, as this lesion appears to closely approximate several loops of bowel on prior CT. Gynecologic referral for surgical consultation recommended. Additionally, further assessment with dedicated MRI of the pelvis, with and without contrast, suggested for further characterization. 2. Status post hysterectomy with nonvisualization of the uterus and left ovary. No other acute abnormality within the pelvis. No free fluid. Electronically Signed   By: Jeannine Boga M.D.   On: 05/04/2018 19:29   Ct Abdomen Pelvis W Contrast  Result Date: 05/04/2018 CLINICAL DATA:  67 year old female  with acute abdominal and pelvic pain and vomiting. History of hysterectomy. EXAM: CT ABDOMEN AND PELVIS WITH CONTRAST TECHNIQUE: Multidetector CT imaging of the abdomen and pelvis was performed using the standard protocol following bolus administration of intravenous contrast. CONTRAST:  187mL OMNIPAQUE IOHEXOL 300 MG/ML  SOLN COMPARISON:  04/24/2010 ultrasound FINDINGS: Lower chest: Mild bibasilar atelectasis/scarring noted. Hepatobiliary: The liver and gallbladder are unremarkable except for small hepatic cysts. No biliary dilatation. Pancreas: Unremarkable Spleen: Unremarkable except for a splenic cyst Adrenals/Urinary Tract: The kidneys, adrenal glands and bladder are unremarkable except for small renal cysts. Stomach/Bowel: Stomach is within normal limits. Appendix appears normal. No evidence of bowel wall thickening, distention, or inflammatory changes. Vascular/Lymphatic: Aortic atherosclerosis. No enlarged abdominal or pelvic lymph nodes. Reproductive: A 6 x 8 x 4 cm lobulated mass containing calcifications, possible fat and possible cystic areas is identified in the RIGHT adnexal region. Patient is status post hysterectomy. Other: No ascites, focal collection or pneumoperitoneum. Musculoskeletal: No acute or suspicious bony abnormalities. Multilevel degenerative changes in the LOWER lumbar spine and LOWER thoracic spine identified. IMPRESSION: 1. No evidence of acute abnormality. 2. Indeterminate 6 x 8 x 4 cm RIGHT adnexal mass. If no prior outside studies are available for comparison, consider ultrasound or MRI with and without contrast for further evaluation. 3.  Aortic Atherosclerosis (ICD10-I70.0). Electronically Signed   By: Margarette Canada M.D.   On: 05/04/2018 18:06   US Pelvic Doppler (torsion R/o Or Mass Arterial Flow)  Result Date: 05/04/2018 CLINICAL DATA:  Initial evaluation for right lower quadrant pain for 1 day, adnexal mass on prior CT. EXAM: TRANSABDOMINAL AND TRANSVAGINAL ULTRASOUND OF  PELVIS DOPPLER ULTRASOUND OF OVARIES TECHNIQUE: Both transabdominal and transvaginal ultrasound examinations of the pelvis were performed. Transabdominal technique was performed for global imaging of the pelvis including uterus, ovaries, adnexal regions, and pelvic cul-de-sac. It was necessary to proceed with endovaginal exam following the transabdominal exam to visualize the pelvic structures. Color and duplex Doppler ultrasound was utilized to evaluate blood flow to the ovaries. COMPARISON:  Prior CT from earlier the same day. FINDINGS: Uterus Status post hysterectomy.  No abnormality about the vaginal cuff. Endometrium Surgically absent. Right ovary No definite native ovarian tissue identified. 7.5 x 7.3 x 7.5 cm heterogeneous lesion present within the right adnexa, corresponding with abnormality seen on prior CT. Lesion demonstrates scattered areas of internal echogenicity with shadowing, likely reflecting calcification. Arterial and venous flow seen within this lesion. Left ovary Not visualized.  No adnexal mass. Pulsed Doppler evaluation of both ovaries demonstrates normal low-resistance arterial and venous waveforms. Other findings No abnormal free fluid. IMPRESSION: 1. 7.5 cm ill-defined shadowing right adnexal mass, corresponding with lesion seen on prior CT. Finding is indeterminate, with differential considerations including a possible solid ovarian neoplasm, although no definite native ovarian tissue identified on this exam. Possible mass arising from the GI tract such is a GIST tumor could also be considered, as this lesion appears to  closely approximate several loops of bowel on prior CT. Gynecologic referral for surgical consultation recommended. Additionally, further assessment with dedicated MRI of the pelvis, with and without contrast, suggested for further characterization. 2. Status post hysterectomy with nonvisualization of the uterus and left ovary. No other acute abnormality within the pelvis.  No free fluid. Electronically Signed   By: Jeannine Boga M.D.   On: 05/04/2018 19:29    Assessment: Patient Active Problem List   Diagnosis Date Noted  . Adnexal mass 05/05/2018  . Breast cancer of upper-outer quadrant of left female breast ([place]) 09/02/2015    Plan: 1. hospitalist consult 2. Nebulizer treatment preop  Due to the nature of her pain unresolved with opioid therapy, this case is urgent and should not be delayed.     Patient will undergo surgical management with Lap BSO.   The risks of surgery were discussed in detail with the patient including but not limited to: bleeding which may require transfusion or reoperation; infection which may require antibiotics; injury to surrounding organs which may involve bowel, bladder, ureters ; need for additional procedures including laparoscopy or laparotomy; thromboembolic phenomenon, surgical site problems and other postoperative/anesthesia complications. Likelihood of success in alleviating the patient's condition was discussed. Routine postoperative instructions will be reviewed with the patient and her family in detail after surgery.  The patient concurred with the proposed plan, giving informed written consent for the surgery.  Patient has been NPO since last night she will remain NPO for procedure.  Anesthesia and OR aware. SCDs ordered on call to the OR.  To OR when ready.  ----- Larey Days, MD Attending Obstetrician and Gynecologist [facility], Department of [facility]

## 2018-05-06 NOTE — Progress Notes (Signed)
Echocardiogram performed. Patient cleared for surgery. Patient transported to OR by Dr. Leonides Schanz at this time.    Hilbert Bible, RN

## 2018-05-06 NOTE — Progress Notes (Signed)
*  PRELIMINARY RESULTS* Echocardiogram 2D Echocardiogram has been performed.  Sherrie Sport 05/06/2018, 12:57 PM

## 2018-05-06 NOTE — Consult Note (Signed)
[facility] NAME: Jaimey Franchini    MR#:  [id number]  DATE OF BIRTH:  [birth year]  DATE OF CONSULT:  05/06/2018  PRIMARY CARE PHYSICIAN: Baxter Hire, MD   REQUESTING/REFERRING PHYSICIAN: Dr. Vikki Ports Ward  CHIEF COMPLAINT:  No chief complaint on file. Pre-operative Evaluation  HISTORY OF PRESENT ILLNESS:  Deone Omahoney  is a 67 y.o. female with a known history of breast cancer currently in remission, history of fibroids, history of colon cancer, endometriosis, hepatitis B, the OPD with ongoing tobacco abuse who presented to the hospital due to abdominal pain.  Patient says she was in usual state of health and developed the right lower quadrant abdominal pain associate with nausea vomiting 2 days ago.  She presented to the ER and underwent a CT scan of the abdomen pelvis which showed a right adnexal mass.  She spent suspected to have ovarian torsion and plan is for laparoscopic bilateral esophageal nephrectomy.  Post services were consulted for preoperative evaluation and clearance.  Patient presently denies any chest pain, she says she has a cough with some congestion but no fever chills.  She denies any melena hematochezia, dysuria, palpitations syncope or any other associated symptoms presently.  PAST MEDICAL HISTORY:   Past Medical History:  Diagnosis Date  . Anemia   . Arthritis   . Breast cancer ([place]) 2017   Left breast cancer  . Cancer ([place]) 2005   COLON CA  . Colon cancer ([place])   . Dysrhythmia    PALPITATIONS  . Endometriosis   . Family history of adverse reaction to anesthesia    MOM-NAUSEATED  . Fibroids   . Heart murmur   . Hepatitis B    AGE 67  . Hepatitis C 1999   NO CIRRHOSIS  . Personal history of radiation therapy 2017   Left breast  . Seasonal allergies     PAST SURGICAL HISTOIRY:   Past Surgical History:  Procedure Laterality Date  . ABDOMINAL HYSTERECTOMY     pt states hemorrhage during hysterectomy  but had never had any problems with prior surgery  . BREAST BIOPSY Left 08/09/2015   2 areas path pending  . COLONOSCOPY     MULTIPLE  . EXCISION OF BREAST LESION Left 09/04/2015   Procedure: EXCISION OF SKIN LESION @ AREOLA LEFT BREAST;  Surgeon: Leonie Green, MD;  Location: [facility] ORS;  Service: General;  Laterality: Left;  Marland Kitchen MASTECTOMY W/ SENTINEL NODE BIOPSY Left 09/04/2015   Procedure: MASTECTOMY WITH SENTINEL LYMPH NODE BIOPSY;  Surgeon: Leonie Green, MD;  Location: [facility] ORS;  Service: General;  Laterality: Left;  . PARTIAL MASTECTOMY WITH NEEDLE LOCALIZATION Left 09/04/2015   Procedure: PARTIAL MASTECTOMY WITH PREOP XRAY NEEDLE LOCALIZATION;  Surgeon: Leonie Green, MD;  Location: [facility] ORS;  Service: General;  Laterality: Left;  . TUBAL LIGATION      SOCIAL HISTORY:   Social History   Tobacco Use  . Smoking status: Former Smoker    Packs/day: 0.50    Years: 35.00    Pack years: 17.50    Types: E-cigarettes  . Smokeless tobacco: Former Systems developer    Quit date: 08/19/2012  . Tobacco comment: VAPOR CIG   Substance Use Topics  . Alcohol use: No    FAMILY HISTORY:   Family History  Problem Relation Age of Onset  . Liver cancer Mother   . Colon cancer Maternal Grandmother   . Breast cancer Cousin  DRUG ALLERGIES:   Allergies  Allergen Reactions  . Other Other (See Comments)    DARVOCET    REVIEW OF SYSTEMS:   Review of Systems  Constitutional: Negative for fever and weight loss.  HENT: Negative for congestion, nosebleeds and tinnitus.   Eyes: Negative for blurred vision, double vision and redness.  Respiratory: Positive for cough and sputum production. Negative for hemoptysis and shortness of breath.   Cardiovascular: Negative for chest pain, orthopnea, leg swelling and PND.  Gastrointestinal: Positive for abdominal pain. Negative for diarrhea, melena, nausea and vomiting.  Genitourinary: Negative for dysuria, hematuria and urgency.  Musculoskeletal:  Negative for falls and joint pain.  Neurological: Negative for dizziness, tingling, sensory change, focal weakness, seizures, weakness and headaches.  Endo/Heme/Allergies: Negative for polydipsia. Does not bruise/bleed easily.  Psychiatric/Behavioral: Negative for depression and memory loss. The patient is not nervous/anxious.      MEDICATIONS AT HOME:   Prior to Admission medications   Medication Sig Start Date End Date Taking? Authorizing Provider  acetaminophen (TYLENOL) 500 MG tablet Take 1,000 mg by mouth every 6 (six) hours as needed.    Yes [provider]  anastrozole (ARIMIDEX) 1 MG tablet TAKE 1 TABLET(1 MG) BY MOUTH DAILY 09/07/17  Yes Lloyd Huger, MD  amoxicillin (AMOXIL) 875 MG tablet Take 1 tablet (875 mg total) by mouth 2 (two) times daily for 10 days. 05/04/18 05/14/18  Orbie Pyo, MD  diphenhydrAMINE (BENADRYL) 25 MG tablet Take 25 mg by mouth every 6 (six) hours as needed.    [provider]  Multiple Vitamins-Minerals (CENTRUM ULTRA WOMENS PO) Take 1 tablet by mouth daily.     [provider]  naproxen sodium (RA NAPROXEN SODIUM) 220 MG tablet Take 220 mg by mouth daily as needed.     [provider]  ondansetron (ZOFRAN) 4 MG tablet Take 1 tablet (4 mg total) by mouth daily as needed. 05/04/18   Schaevitz, Randall An, MD  oxyCODONE-acetaminophen (PERCOCET) 5-325 MG tablet Take 1 tablet by mouth every 4 (four) hours as needed. 05/04/18 05/04/19  Orbie Pyo, MD      VITAL SIGNS:  Blood pressure 105/63, pulse 90, temperature 98.9 F (37.2 C), temperature source Oral, resp. rate 16, height 5\' 3"  (1.6 m), weight 88 kg, SpO2 98 %.  PHYSICAL EXAMINATION:  GENERAL:  68 y.o.-year-old patient lying in the bed in no acute distress.  EYES: Pupils equal, round, reactive to light and accommodation. No scleral icterus. Extraocular muscles intact.  HEENT: Head atraumatic, normocephalic. Oropharynx and nasopharynx  clear.  NECK:  Supple, no jugular venous distention. No thyroid enlargement, no tenderness.  LUNGS: Normal breath sounds bilaterally, no wheezing, rales, rhonchi . No use of accessory muscles of respiration.  CARDIOVASCULAR: S1, S2, RRR. No murmurs, rubs, gallops, clicks.  ABDOMEN: Soft, Tender in the RLQ, no rebound, rigidity, nondistended. Bowel sounds present. No organomegaly or mass.  EXTREMITIES: No pedal edema, cyanosis, or clubbing.  NEUROLOGIC: Cranial nerves II through XII are intact. No focal motor or sensory deficits appreciated bilaterally  PSYCHIATRIC: The patient is alert and oriented x 3. Good affect SKIN: No obvious rash, lesion, or ulcer.   LABORATORY PANEL:   CBC Recent Labs  Lab 05/05/18 1333  WBC 18.5*  HGB 13.4  HCT 40.5  PLT 382   ------------------------------------------------------------------------------------------------------------------  Chemistries  Recent Labs  Lab 05/05/18 1333  NA 136  K 3.4*  CL 100  CO2 24  GLUCOSE 124*  BUN 8  CREATININE 0.87  CALCIUM 9.2  AST 36  ALT 15  ALKPHOS 63  BILITOT 0.8   ------------------------------------------------------------------------------------------------------------------  Cardiac Enzymes No results for input(s): TROPONINI in the last 168 hours. ------------------------------------------------------------------------------------------------------------------  RADIOLOGY:  Dg Chest 2 View  Result Date: 05/05/2018 CLINICAL DATA:  Preoperative respiratory evaluation prior to hysterectomy and BILATERAL oophorectomy due to a mass in the RIGHT adnexum. Personal history of LEFT mastectomy for breast cancer. EXAM: CHEST - 2 VIEW COMPARISON:  No prior chest x-ray. Thoracic spine x-rays 06/19/2017 are correlated. FINDINGS: AP ERECT and LATERAL images were obtained. Suboptimal inspiration. Chronic elevation of the LEFT hemidiaphragm (visible on the prior thoracic spine x-rays) with chronic scar/atelectasis  involving the LEFT LOWER LOBE and lingula. Lungs otherwise clear. No pulmonary parenchymal nodules. No pleural effusions. Degenerative changes involving the thoracic spine. IMPRESSION: Suboptimal inspiration. Chronic elevation of the LEFT hemidiaphragm with chronic scar/atelectasis involving the LEFT LOWER LOBE and lingula. No acute cardiopulmonary disease otherwise. Electronically Signed   By: Evangeline Dakin M.D.   On: 05/05/2018 15:56   US Pelvis Transvanginal Non-ob (tv Only)  Result Date: 05/04/2018 CLINICAL DATA:  Initial evaluation for right lower quadrant pain for 1 day, adnexal mass on prior CT. EXAM: TRANSABDOMINAL AND TRANSVAGINAL ULTRASOUND OF PELVIS DOPPLER ULTRASOUND OF OVARIES TECHNIQUE: Both transabdominal and transvaginal ultrasound examinations of the pelvis were performed. Transabdominal technique was performed for global imaging of the pelvis including uterus, ovaries, adnexal regions, and pelvic cul-de-sac. It was necessary to proceed with endovaginal exam following the transabdominal exam to visualize the pelvic structures. Color and duplex Doppler ultrasound was utilized to evaluate blood flow to the ovaries. COMPARISON:  Prior CT from earlier the same day. FINDINGS: Uterus Status post hysterectomy.  No abnormality about the vaginal cuff. Endometrium Surgically absent. Right ovary No definite native ovarian tissue identified. 7.5 x 7.3 x 7.5 cm heterogeneous lesion present within the right adnexa, corresponding with abnormality seen on prior CT. Lesion demonstrates scattered areas of internal echogenicity with shadowing, likely reflecting calcification. Arterial and venous flow seen within this lesion. Left ovary Not visualized.  No adnexal mass. Pulsed Doppler evaluation of both ovaries demonstrates normal low-resistance arterial and venous waveforms. Other findings No abnormal free fluid. IMPRESSION: 1. 7.5 cm ill-defined shadowing right adnexal mass, corresponding with lesion seen on  prior CT. Finding is indeterminate, with differential considerations including a possible solid ovarian neoplasm, although no definite native ovarian tissue identified on this exam. Possible mass arising from the GI tract such is a GIST tumor could also be considered, as this lesion appears to closely approximate several loops of bowel on prior CT. Gynecologic referral for surgical consultation recommended. Additionally, further assessment with dedicated MRI of the pelvis, with and without contrast, suggested for further characterization. 2. Status post hysterectomy with nonvisualization of the uterus and left ovary. No other acute abnormality within the pelvis. No free fluid. Electronically Signed   By: Jeannine Boga M.D.   On: 05/04/2018 19:29   US Pelvis Complete  Result Date: 05/04/2018 CLINICAL DATA:  Initial evaluation for right lower quadrant pain for 1 day, adnexal mass on prior CT. EXAM: TRANSABDOMINAL AND TRANSVAGINAL ULTRASOUND OF PELVIS DOPPLER ULTRASOUND OF OVARIES TECHNIQUE: Both transabdominal and transvaginal ultrasound examinations of the pelvis were performed. Transabdominal technique was performed for global imaging of the pelvis including uterus, ovaries, adnexal regions, and pelvic cul-de-sac. It was necessary to proceed with endovaginal exam following the transabdominal exam to visualize the pelvic structures. Color and duplex Doppler ultrasound was utilized to evaluate blood flow  to the ovaries. COMPARISON:  Prior CT from earlier the same day. FINDINGS: Uterus Status post hysterectomy.  No abnormality about the vaginal cuff. Endometrium Surgically absent. Right ovary No definite native ovarian tissue identified. 7.5 x 7.3 x 7.5 cm heterogeneous lesion present within the right adnexa, corresponding with abnormality seen on prior CT. Lesion demonstrates scattered areas of internal echogenicity with shadowing, likely reflecting calcification. Arterial and venous flow seen within this  lesion. Left ovary Not visualized.  No adnexal mass. Pulsed Doppler evaluation of both ovaries demonstrates normal low-resistance arterial and venous waveforms. Other findings No abnormal free fluid. IMPRESSION: 1. 7.5 cm ill-defined shadowing right adnexal mass, corresponding with lesion seen on prior CT. Finding is indeterminate, with differential considerations including a possible solid ovarian neoplasm, although no definite native ovarian tissue identified on this exam. Possible mass arising from the GI tract such is a GIST tumor could also be considered, as this lesion appears to closely approximate several loops of bowel on prior CT. Gynecologic referral for surgical consultation recommended. Additionally, further assessment with dedicated MRI of the pelvis, with and without contrast, suggested for further characterization. 2. Status post hysterectomy with nonvisualization of the uterus and left ovary. No other acute abnormality within the pelvis. No free fluid. Electronically Signed   By: Jeannine Boga M.D.   On: 05/04/2018 19:29   Ct Abdomen Pelvis W Contrast  Result Date: 05/04/2018 CLINICAL DATA:  67 year old female with acute abdominal and pelvic pain and vomiting. History of hysterectomy. EXAM: CT ABDOMEN AND PELVIS WITH CONTRAST TECHNIQUE: Multidetector CT imaging of the abdomen and pelvis was performed using the standard protocol following bolus administration of intravenous contrast. CONTRAST:  140mL OMNIPAQUE IOHEXOL 300 MG/ML  SOLN COMPARISON:  04/24/2010 ultrasound FINDINGS: Lower chest: Mild bibasilar atelectasis/scarring noted. Hepatobiliary: The liver and gallbladder are unremarkable except for small hepatic cysts. No biliary dilatation. Pancreas: Unremarkable Spleen: Unremarkable except for a splenic cyst Adrenals/Urinary Tract: The kidneys, adrenal glands and bladder are unremarkable except for small renal cysts. Stomach/Bowel: Stomach is within normal limits. Appendix appears  normal. No evidence of bowel wall thickening, distention, or inflammatory changes. Vascular/Lymphatic: Aortic atherosclerosis. No enlarged abdominal or pelvic lymph nodes. Reproductive: A 6 x 8 x 4 cm lobulated mass containing calcifications, possible fat and possible cystic areas is identified in the RIGHT adnexal region. Patient is status post hysterectomy. Other: No ascites, focal collection or pneumoperitoneum. Musculoskeletal: No acute or suspicious bony abnormalities. Multilevel degenerative changes in the LOWER lumbar spine and LOWER thoracic spine identified. IMPRESSION: 1. No evidence of acute abnormality. 2. Indeterminate 6 x 8 x 4 cm RIGHT adnexal mass. If no prior outside studies are available for comparison, consider ultrasound or MRI with and without contrast for further evaluation. 3.  Aortic Atherosclerosis (ICD10-I70.0). Electronically Signed   By: Margarette Canada M.D.   On: 05/04/2018 18:06   US Pelvic Doppler (torsion R/o Or Mass Arterial Flow)  Result Date: 05/04/2018 CLINICAL DATA:  Initial evaluation for right lower quadrant pain for 1 day, adnexal mass on prior CT. EXAM: TRANSABDOMINAL AND TRANSVAGINAL ULTRASOUND OF PELVIS DOPPLER ULTRASOUND OF OVARIES TECHNIQUE: Both transabdominal and transvaginal ultrasound examinations of the pelvis were performed. Transabdominal technique was performed for global imaging of the pelvis including uterus, ovaries, adnexal regions, and pelvic cul-de-sac. It was necessary to proceed with endovaginal exam following the transabdominal exam to visualize the pelvic structures. Color and duplex Doppler ultrasound was utilized to evaluate blood flow to the ovaries. COMPARISON:  Prior CT from earlier the  same day. FINDINGS: Uterus Status post hysterectomy.  No abnormality about the vaginal cuff. Endometrium Surgically absent. Right ovary No definite native ovarian tissue identified. 7.5 x 7.3 x 7.5 cm heterogeneous lesion present within the right adnexa,  corresponding with abnormality seen on prior CT. Lesion demonstrates scattered areas of internal echogenicity with shadowing, likely reflecting calcification. Arterial and venous flow seen within this lesion. Left ovary Not visualized.  No adnexal mass. Pulsed Doppler evaluation of both ovaries demonstrates normal low-resistance arterial and venous waveforms. Other findings No abnormal free fluid. IMPRESSION: 1. 7.5 cm ill-defined shadowing right adnexal mass, corresponding with lesion seen on prior CT. Finding is indeterminate, with differential considerations including a possible solid ovarian neoplasm, although no definite native ovarian tissue identified on this exam. Possible mass arising from the GI tract such is a GIST tumor could also be considered, as this lesion appears to closely approximate several loops of bowel on prior CT. Gynecologic referral for surgical consultation recommended. Additionally, further assessment with dedicated MRI of the pelvis, with and without contrast, suggested for further characterization. 2. Status post hysterectomy with nonvisualization of the uterus and left ovary. No other acute abnormality within the pelvis. No free fluid. Electronically Signed   By: Jeannine Boga M.D.   On: 05/04/2018 19:29     IMPRESSION AND PLAN:   67 year old female with past medical history of breast cancer, colon cancer, hepatitis B and C, COPD with ongoing tobacco abuse, presented to the hospital due to right lower quadrant abdominal pain underwent CT scan of the abdomen pelvis which is suggestive of a right adnexal mass.  Patient is going for bilateral Seffinger oophorectomy.  Hospitalist services were contacted for preoperative medical clearance.  1.  Preoperative cardiovascular examination- patient is a low to moderate risk for noncardiac surgery. - Patient's preoperative EKG shows T wave inversions in lead I and aVL which are new from her previous EKG in January of this year.   Patient is clinically asymptomatic.  We will get an echocardiogram.  Also consult cardiology. -This was discussed with the OB/GYN physician.  2.  Abnormal EKG- continue care as mentioned above.  Patient is clinically asymptomatic.  3.  COPD with ongoing tobacco abuse-no evidence of acute COPD exacerbation.  -continue duo nebs. Will add Pulmicort nebs.   4.  Right adnexal mass- continue further care as per OB/GYN.  Plan for bilateral esophageal nephrectomy today once medical clearance has been received.   Thanks for the consult and will follow with you.   All the records are reviewed and case discussed with Consulting provider. Management plans discussed with the patient, family and they are in agreement.  CODE STATUS: Full code  TOTAL TIME TAKING CARE OF THIS PATIENT: 50 minutes.    Henreitta Leber M.D on 05/06/2018 at 10:40 AM  Between 7am to 6pm - Pager - [phone]  After 6pm go to [email or web address] - password [email or web address] [facility]  [facility]  Office  [phone]  CC: Primary care Physician: Baxter Hire, MD

## 2018-05-06 NOTE — H&P (Signed)
Gynecologic History and Physical   Referring Provider: Dr. Alyssa Grove  Admitting MD: Dr. Vikki Ports Kawhi Diebold  Chief Complaint: Adnexal Mass  Subjective:  Debra Joseph is a 67 y.o. female, s/p TH (benign disease with bilateral ovaries in situ) with h/o stage Ia breast cancer seen in consultation from Dr. Grayland Ormond for right adnexal mass.   She presented to ER on 05/04/18 for RLQ abdominal pain with emesis.   05/04/18 CT Abdomen Pelvis w contrast which showed 6 x 8 x 4 cm lobulated mass containing calcifications in right adnexal region.   05/04/18- US Pelvis with transvaginal and pelvic doppler showed:  1. 7.5 cm ill-defined shadowing right adnexal mass, corresponding with lesion seen on prior CT. Finding is indeterminate, with differential considerations including a possible solid ovarian neoplasm, although no definite native ovarian tissue identified on this exam. Possible mass arising from the GI tract such is a GIST tumor could also be considered, as this lesion appears to closely approximate several loops of bowel on prior CT. Gynecologic referral for surgical consultation recommended. Additionally, further assessment with dedicated MRI of the pelvis, with and without contrast, suggested for further characterization.  2. Status post hysterectomy with nonvisualization of the uterus and left ovary. No other acute abnormality within the pelvis. No free fluid.  She reports spotting in 2018 and some in 2019 which she accounted to forgetting Arimidex at the time.   She reports history of abnormal pap smears with 'cautery' followed by normal pap smears but estimates this was ~ 25 years ago. No recent pap smears.   She has history of abdominal hysterectomy for uterine fibroids and heavy bleeding in 1990s.  She had post-op bleeding which required return to OR and blood transfusion.   Current every day Vape use with 3% nicotine. Is interested in quitting. Reports recent stress as contributor  with recent passing of her father.   History of hepatitis c and b; not currently on treatment.   She has history of pathologic stage Ia ER/PR positive, HER-2 negative adenocarcinoma of upper outer quadrant of left breast s/p left partial mastectomy with sentinel lymph node biopsy on 09/04/15 with Dr. Tamala Julian. Dimension was 60m. SLN was negative. She received adjuvant XRT, completed on 11/22/15. She is currently on anastrozole adjuvantly with plan for 5 years of therapy.   On her PMH is states she has a h/o colon cancer but we could not find verification. It does appear she had colonic polyps. Colonoscopy in 2011 was negative.   [facility] and complains of increasing pain (pain score 9) with nausea and inability to tolerate oral intake.   Problem List:     Patient Active Problem List   Diagnosis Date Noted  . Adnexal mass 05/05/2018  . Breast cancer of upper-outer quadrant of left female breast ([facility] 09/02/2015    Past Medical History:     Past Medical History:  Diagnosis Date  . Anemia   . Arthritis   . Breast cancer ([facility] [place]   [place] breast cancer  . [place]   COL[place]  . [place] cancer ([facility]   . Dysrhythmia    PALPITATIONS  . Endometriosis   . Family history of adverse reaction to anesthesia    MOM-NAUSEATED  . Fibroids   . Heart murmur   . Hepatitis B    AGE 27  . Hepatitis C 1999   NO CIRRHOSIS  . Personal history of radiation therapy 2017  Left breast  . Seasonal allergies     Past Surgical History:      Past Surgical History:  Procedure Laterality Date  . ABDOMINAL HYSTERECTOMY     pt states hemorrhage during hysterectomy but had never had any problems with prior surgery  . BREAST BIOPSY Left 08/09/2015   2 areas path pending  . COLONOSCOPY     MULTIPLE  . EXCISION OF BREAST LESION Left 09/04/2015   Procedure: EXCISION OF SKIN LESION @ AREOLA LEFT BREAST;  Surgeon: Leonie Green, MD;  Location: [facility] ORS;  Service: General;  Laterality: Left;  Marland Kitchen MASTECTOMY W/ SENTINEL NODE BIOPSY Left 09/04/2015   Procedure: MASTECTOMY WITH SENTINEL LYMPH NODE BIOPSY;  Surgeon: Leonie Green, MD;  Location: [facility] ORS;  Service: General;  Laterality: Left;  . PARTIAL MASTECTOMY WITH NEEDLE LOCALIZATION Left 09/04/2015   Procedure: PARTIAL MASTECTOMY WITH PREOP XRAY NEEDLE LOCALIZATION;  Surgeon: Leonie Green, MD;  Location: [facility] ORS;  Service: General;  Laterality: Left;  . TUBAL LIGATION      Past Gynecologic History:  Menarche: age 35 Regular menses Hx of tubal ligation G2P2- vaginal delivery History of abnormal pap- yes per hpi History of STDs: denies (hx of hepatitis b and c)  OB History:  OB History  No data available    Family History:      Family History  Problem Relation Age of Onset  . Liver cancer Mother   . Colon cancer Maternal Grandmother   . Breast cancer Cousin     Social History: Social History        Socioeconomic History  . Marital status: Married    Spouse name: Not on file  . Number of children: Not on file  . Years of education: Not on file  . Highest education level: Not on file  Occupational History  . Not on file  Social Needs  . Financial resource strain: Not on file  . Food insecurity:    Worry: Not on file    Inability: Not on file  . Transportation needs:    Medical: Not on file    Non-medical: Not on file  Tobacco Use  . Smoking status: Former Smoker    Packs/day: 0.50    Years: 35.00    Pack years: 17.50    Types: E-cigarettes  . Smokeless tobacco: Former Systems developer    Quit date: 08/19/2012  . Tobacco comment: VAPOR CIG   Substance and Sexual Activity  . Alcohol use: No  . Drug use: No  . Sexual activity: Yes  Lifestyle  . Physical activity:    Days per week: Not on file    Minutes per session: Not on file  . Stress: Not on file  Relationships  . Social  connections:    Talks on phone: Not on file    Gets together: Not on file    Attends religious service: Not on file    Active member of club or organization: Not on file    Attends meetings of clubs or organizations: Not on file    Relationship status: Not on file  . Intimate partner violence:    Fear of current or ex partner: Not on file    Emotionally abused: Not on file    Physically abused: Not on file    Forced sexual activity: Not on file  Other Topics Concern  . Not on file  Social History Narrative  . Not on file    Allergies:  Allergies  Allergen Reactions  . Other Other (See Comments)    DARVOCET    Current Medications: No current facility-administered medications for this visit.    No current outpatient medications on file.            Facility-Administered Medications Ordered in Other Visits  Medication Dose Route Frequency Provider Last Rate Last Dose  . 0.9 %  sodium chloride infusion   Intravenous Continuous Verlon Au, NP      . diphenhydrAMINE (BENADRYL) injection 12.5 mg  12.5 mg Intravenous Q6H PRN Verlon Au, NP       Or  . diphenhydrAMINE (BENADRYL) 12.5 MG/5ML elixir 12.5 mg  12.5 mg Oral Q6H PRN Verlon Au, NP      . morphine 2 mg/mL PCA injection   Intravenous Q4H Benjaman Kindler, MD      . naloxone [facility]) injection 0.4 mg  0.4 mg Intravenous PRN Verlon Au, NP       And  . sodium chloride flush (NS) 0.9 % injection 9 mL  9 mL Intravenous PRN Verlon Au, NP      . ondansetron (ZOFRAN) tablet 4 mg  4 mg Oral Q6H PRN Verlon Au, NP       Or  . ondansetron (ZOFRAN) injection 4 mg  4 mg Intravenous Q6H PRN Verlon Au, NP        Review of Systems General:  Weight gain; hot flashes w/ arimidex Skin: no complaints Eyes: no complaints HEENT: Right ear 'plugged', hoarsness, possible URI Breasts: no complaints Pulmonary: no complaints Cardiac: no complaints Gastrointestinal:  intractable nausea and vomiting, RLQ abdominal pain sharp 5/10 w/ oxycodone; not tolerating orals. Last BM this past Monday.  Genitourinary/Sexual: no complaints Ob/Gyn: no complaints Musculoskeletal: no complaints Hematology: no complaints Neurologic/Psych: no complaints   Objective:  Physical Examination:  BP 124/79   Pulse 72   Temp 97.9 F (36.6 C) (Oral)   Resp 18   Ht _0  (1.6 m)   Wt 194 lb 1.6 oz (88 kg)   BMI 34.38 kg/m     ECOG Performance Status: 3 - Symptomatic, >50% confined to bed  GENERAL: fatigued, ill appearing female. In pajamas. Accompanied by husband.  HEENT:  Sclerae anicteric.  Oropharynx clear and moist. No ulcerations or evidence of oropharyngeal candidiasis. Neck is supple.  NODES:  No cervical, supraclavicular, or axillary lymphadenopathy palpated.  LUNGS:  Clear to auscultation bilaterally. Wheezes at bilateral apices HEART:  Regular rate and rhythm. No murmur appreciated. ABDOMEN:  Soft. RLQ tender to palpation.  No rebound or guarding. Not distended. No palpable masses, ascites or hepatospenomegaly. MSK:  No focal spinal tenderness to palpation. Full range of motion bilaterally in the upper extremities. EXTREMITIES:  No peripheral edema.   SKIN:  Clear with no obvious rashes or skin changes. No nail dyscrasia. NEURO:  Nonfocal. Well oriented.  Appropriate affect.  Pelvic: Chaperoned by NP EGBUS: no lesions Vagina: no discharge or bleeding; excoriation at the top of the vaginal cuff but no other gross lesions Cervix: surgically absent Uterus: surgically absent Adnexa: palpable mobile mass RLQ 8 cm and tender to palpation. Left adnexa negative for masses.  Rectovaginal: confirmatory; no involvement with the rectum and the mass.   Lab Review Labs on site today: CBC, CMP, CA-125  Radiologic Imaging: No imaging on site today   Assessment:  Mahogany B Carrender is a 67 y.o. female diagnosed with complex right adnexal mass with symptoms  concerning for torsion  given intractable pain. Differential diagnosis includes metastatic breast cancer (unlikely), ovarian malignancy, or other benign ovarian masses (ie dermoid given calcification noted on imaging).   Medical co-morbidities complicating care: h/o breast cancer; prior laparotomies; Hepatitis B and C; Body mass index is 34.38 kg/m.  Plan:      Problem List Items Addressed This Visit    None          Visit Diagnoses    Adnexal mass    -  Primary   Relevant Orders   CA 125   CBC with Differential/Platelet (Completed)   Comprehensive metabolic panel (Completed)   Type and screen      Dr. Theora Gianotti discussed options and given intractable pain with inability to tolerate oral intake and recommended hospital admission for pain control, IV fluids, anti-emetics, and surgery with bilateral salpingo-oophorectomy.   I have seen and examined this patient, and confirmed her surgery for tomorrow. Planned procedure:  Laparoscopic BSO with possible mini-laparotomy.  Consents signed today.  ----- Larey Days, MD Attending Obstetrician and Gynecologist [facility], Department of [facility]    CC:  Dr. Grayland Ormond

## 2018-05-06 NOTE — Consult Note (Signed)
[facility] Cardiology  CARDIOLOGY CONSULT NOTE  Patient ID: Debra Joseph MRN: [id number] DOB/AGE: 1951-01-15 67 y.o.  Admit date: 05/05/2018 Referring Physician Ward Primary Physician [facility] Cardiologist  Reason for Consultation preoperative cardiovascular evaluation  HPI: 67 year old female referred for preoperative cardiovascular evaluation prior to surgery for right adnexal mass.  Preoperative ECG revealed sinus rhythm with lateral T wave abnormalities.  She denies chest pain or shortness of breath.  She reports occasional palpitations.  She has no significant cardiovascular risk factors.  The patient denies hypertension, diabetes, hyperlipidemia, tobacco abuse, or family history of coronary disease.  She has no prior history of myocardial infarction, stroke, congestive heart failure, diabetes, or chronic kidney disease.  2D echocardiogram was performed today, which revealed normal left ventricular function, without significant valvular abnormalities.  Review of systems complete and found to be negative unless listed above     Past Medical History:  Diagnosis Date  . Anemia   . Arthritis   . Breast cancer ([place]) 2017   Left breast cancer  . Cancer ([place]) 2005   COLON CA  . Colon cancer ([place])   . Dysrhythmia    PALPITATIONS  . Endometriosis   . Family history of adverse reaction to anesthesia    MOM-NAUSEATED  . Fibroids   . Heart murmur   . Hepatitis B    AGE 13  . Hepatitis C 1999   NO CIRRHOSIS  . Personal history of radiation therapy 2017   Left breast  . Seasonal allergies     Past Surgical History:  Procedure Laterality Date  . ABDOMINAL HYSTERECTOMY     pt states hemorrhage during hysterectomy but had never had any problems with prior surgery  . BREAST BIOPSY Left 08/09/2015   2 areas path pending  . COLONOSCOPY     MULTIPLE  . EXCISION OF BREAST LESION Left 09/04/2015   Procedure: EXCISION OF SKIN LESION @ AREOLA LEFT BREAST;  Surgeon: Leonie Green, MD;   Location: [facility] ORS;  Service: General;  Laterality: Left;  Marland Kitchen MASTECTOMY W/ SENTINEL NODE BIOPSY Left 09/04/2015   Procedure: MASTECTOMY WITH SENTINEL LYMPH NODE BIOPSY;  Surgeon: Leonie Green, MD;  Location: [facility] ORS;  Service: General;  Laterality: Left;  . PARTIAL MASTECTOMY WITH NEEDLE LOCALIZATION Left 09/04/2015   Procedure: PARTIAL MASTECTOMY WITH PREOP XRAY NEEDLE LOCALIZATION;  Surgeon: Leonie Green, MD;  Location: [facility] ORS;  Service: General;  Laterality: Left;  . TUBAL LIGATION      Medications Prior to Admission  Medication Sig Dispense Refill Last Dose  . acetaminophen (TYLENOL) 500 MG tablet Take 1,000 mg by mouth every 6 (six) hours as needed.    Past Week  . anastrozole (ARIMIDEX) 1 MG tablet TAKE 1 TABLET(1 MG) BY MOUTH DAILY 90 tablet 3 Past Week  . amoxicillin (AMOXIL) 875 MG tablet Take 1 tablet (875 mg total) by mouth 2 (two) times daily for 10 days. 20 tablet 0 Unknown  . diphenhydrAMINE (BENADRYL) 25 MG tablet Take 25 mg by mouth every 6 (six) hours as needed.   Unknown  . Multiple Vitamins-Minerals (CENTRUM ULTRA WOMENS PO) Take 1 tablet by mouth daily.    Unknown  . naproxen sodium (RA NAPROXEN SODIUM) 220 MG tablet Take 220 mg by mouth daily as needed.    Unknown  . ondansetron (ZOFRAN) 4 MG tablet Take 1 tablet (4 mg total) by mouth daily as needed. 10 tablet 0 Unknown  . oxyCODONE-acetaminophen (PERCOCET) 5-325 MG tablet Take 1 tablet by mouth  every 4 (four) hours as needed. 12 tablet 0 Unknown   Social History   Socioeconomic History  . Marital status: Married    Spouse name: Not on file  . Number of children: Not on file  . Years of education: Not on file  . Highest education level: Not on file  Occupational History  . Not on file  Social Needs  . Financial resource strain: Not on file  . Food insecurity:    Worry: Not on file    Inability: Not on file  . Transportation needs:    Medical: Not on file    Non-medical: Not on file  Tobacco Use   . Smoking status: Former Smoker    Packs/day: 0.50    Years: 35.00    Pack years: 17.50    Types: E-cigarettes  . Smokeless tobacco: Former Systems developer    Quit date: 08/19/2012  . Tobacco comment: VAPOR CIG   Substance and Sexual Activity  . Alcohol use: No  . Drug use: No  . Sexual activity: Yes  Lifestyle  . Physical activity:    Days per week: Not on file    Minutes per session: Not on file  . Stress: Not on file  Relationships  . Social connections:    Talks on phone: Not on file    Gets together: Not on file    Attends religious service: Not on file    Active member of club or organization: Not on file    Attends meetings of clubs or organizations: Not on file    Relationship status: Not on file  . Intimate partner violence:    Fear of current or ex partner: Not on file    Emotionally abused: Not on file    Physically abused: Not on file    Forced sexual activity: Not on file  Other Topics Concern  . Not on file  Social History Narrative  . Not on file    Family History  Problem Relation Age of Onset  . Liver cancer Mother   . Colon cancer Maternal Grandmother   . Breast cancer Cousin       Review of systems complete and found to be negative unless listed above      PHYSICAL EXAM  General: Well developed, well nourished, in no acute distress HEENT:  Normocephalic and atramatic Neck:  No JVD.  Lungs: Clear bilaterally to auscultation and percussion. Heart: HRRR . Normal S1 and S2 without gallops or murmurs.  Abdomen: Bowel sounds are positive, abdomen soft and non-tender  Msk:  Back normal, normal gait. Normal strength and tone for age. Extremities: No clubbing, cyanosis or edema.   Neuro: Alert and oriented X 3. Psych:  Good affect, responds appropriately  Labs:   Lab Results  Component Value Date   WBC 18.5 (H) 05/05/2018   HGB 13.4 05/05/2018   HCT 40.5 05/05/2018   MCV 92.7 05/05/2018   PLT 382 05/05/2018    Recent Labs  Lab 05/05/18 1333   NA 136  K 3.4*  CL 100  CO2 24  BUN 8  CREATININE 0.87  CALCIUM 9.2  PROT 7.7  BILITOT 0.8  ALKPHOS 63  ALT 15  AST 36  GLUCOSE 124*   No results found for: CKTOTAL, CKMB, CKMBINDEX, TROPONINI No results found for: CHOL No results found for: HDL No results found for: LDLCALC No results found for: TRIG No results found for: CHOLHDL No results found for: LDLDIRECT    Radiology: Dg Chest  2 View  Result Date: 05/05/2018 CLINICAL DATA:  Preoperative respiratory evaluation prior to hysterectomy and BILATERAL oophorectomy due to a mass in the RIGHT adnexum. Personal history of LEFT mastectomy for breast cancer. EXAM: CHEST - 2 VIEW COMPARISON:  No prior chest x-ray. Thoracic spine x-rays 06/19/2017 are correlated. FINDINGS: AP ERECT and LATERAL images were obtained. Suboptimal inspiration. Chronic elevation of the LEFT hemidiaphragm (visible on the prior thoracic spine x-rays) with chronic scar/atelectasis involving the LEFT LOWER LOBE and lingula. Lungs otherwise clear. No pulmonary parenchymal nodules. No pleural effusions. Degenerative changes involving the thoracic spine. IMPRESSION: Suboptimal inspiration. Chronic elevation of the LEFT hemidiaphragm with chronic scar/atelectasis involving the LEFT LOWER LOBE and lingula. No acute cardiopulmonary disease otherwise. Electronically Signed   By: Evangeline Dakin M.D.   On: 05/05/2018 15:56   US Pelvis Transvanginal Non-ob (tv Only)  Result Date: 05/04/2018 CLINICAL DATA:  Initial evaluation for right lower quadrant pain for 1 day, adnexal mass on prior CT. EXAM: TRANSABDOMINAL AND TRANSVAGINAL ULTRASOUND OF PELVIS DOPPLER ULTRASOUND OF OVARIES TECHNIQUE: Both transabdominal and transvaginal ultrasound examinations of the pelvis were performed. Transabdominal technique was performed for global imaging of the pelvis including uterus, ovaries, adnexal regions, and pelvic cul-de-sac. It was necessary to proceed with endovaginal exam following the  transabdominal exam to visualize the pelvic structures. Color and duplex Doppler ultrasound was utilized to evaluate blood flow to the ovaries. COMPARISON:  Prior CT from earlier the same day. FINDINGS: Uterus Status post hysterectomy.  No abnormality about the vaginal cuff. Endometrium Surgically absent. Right ovary No definite native ovarian tissue identified. 7.5 x 7.3 x 7.5 cm heterogeneous lesion present within the right adnexa, corresponding with abnormality seen on prior CT. Lesion demonstrates scattered areas of internal echogenicity with shadowing, likely reflecting calcification. Arterial and venous flow seen within this lesion. Left ovary Not visualized.  No adnexal mass. Pulsed Doppler evaluation of both ovaries demonstrates normal low-resistance arterial and venous waveforms. Other findings No abnormal free fluid. IMPRESSION: 1. 7.5 cm ill-defined shadowing right adnexal mass, corresponding with lesion seen on prior CT. Finding is indeterminate, with differential considerations including a possible solid ovarian neoplasm, although no definite native ovarian tissue identified on this exam. Possible mass arising from the GI tract such is a GIST tumor could also be considered, as this lesion appears to closely approximate several loops of bowel on prior CT. Gynecologic referral for surgical consultation recommended. Additionally, further assessment with dedicated MRI of the pelvis, with and without contrast, suggested for further characterization. 2. Status post hysterectomy with nonvisualization of the uterus and left ovary. No other acute abnormality within the pelvis. No free fluid. Electronically Signed   By: Jeannine Boga M.D.   On: 05/04/2018 19:29   US Pelvis Complete  Result Date: 05/04/2018 CLINICAL DATA:  Initial evaluation for right lower quadrant pain for 1 day, adnexal mass on prior CT. EXAM: TRANSABDOMINAL AND TRANSVAGINAL ULTRASOUND OF PELVIS DOPPLER ULTRASOUND OF OVARIES  TECHNIQUE: Both transabdominal and transvaginal ultrasound examinations of the pelvis were performed. Transabdominal technique was performed for global imaging of the pelvis including uterus, ovaries, adnexal regions, and pelvic cul-de-sac. It was necessary to proceed with endovaginal exam following the transabdominal exam to visualize the pelvic structures. Color and duplex Doppler ultrasound was utilized to evaluate blood flow to the ovaries. COMPARISON:  Prior CT from earlier the same day. FINDINGS: Uterus Status post hysterectomy.  No abnormality about the vaginal cuff. Endometrium Surgically absent. Right ovary No definite native ovarian tissue identified. 7.5  x 7.3 x 7.5 cm heterogeneous lesion present within the right adnexa, corresponding with abnormality seen on prior CT. Lesion demonstrates scattered areas of internal echogenicity with shadowing, likely reflecting calcification. Arterial and venous flow seen within this lesion. Left ovary Not visualized.  No adnexal mass. Pulsed Doppler evaluation of both ovaries demonstrates normal low-resistance arterial and venous waveforms. Other findings No abnormal free fluid. IMPRESSION: 1. 7.5 cm ill-defined shadowing right adnexal mass, corresponding with lesion seen on prior CT. Finding is indeterminate, with differential considerations including a possible solid ovarian neoplasm, although no definite native ovarian tissue identified on this exam. Possible mass arising from the GI tract such is a GIST tumor could also be considered, as this lesion appears to closely approximate several loops of bowel on prior CT. Gynecologic referral for surgical consultation recommended. Additionally, further assessment with dedicated MRI of the pelvis, with and without contrast, suggested for further characterization. 2. Status post hysterectomy with nonvisualization of the uterus and left ovary. No other acute abnormality within the pelvis. No free fluid. Electronically Signed    By: Jeannine Boga M.D.   On: 05/04/2018 19:29   Ct Abdomen Pelvis W Contrast  Result Date: 05/04/2018 CLINICAL DATA:  67 year old female with acute abdominal and pelvic pain and vomiting. History of hysterectomy. EXAM: CT ABDOMEN AND PELVIS WITH CONTRAST TECHNIQUE: Multidetector CT imaging of the abdomen and pelvis was performed using the standard protocol following bolus administration of intravenous contrast. CONTRAST:  178mL OMNIPAQUE IOHEXOL 300 MG/ML  SOLN COMPARISON:  04/24/2010 ultrasound FINDINGS: Lower chest: Mild bibasilar atelectasis/scarring noted. Hepatobiliary: The liver and gallbladder are unremarkable except for small hepatic cysts. No biliary dilatation. Pancreas: Unremarkable Spleen: Unremarkable except for a splenic cyst Adrenals/Urinary Tract: The kidneys, adrenal glands and bladder are unremarkable except for small renal cysts. Stomach/Bowel: Stomach is within normal limits. Appendix appears normal. No evidence of bowel wall thickening, distention, or inflammatory changes. Vascular/Lymphatic: Aortic atherosclerosis. No enlarged abdominal or pelvic lymph nodes. Reproductive: A 6 x 8 x 4 cm lobulated mass containing calcifications, possible fat and possible cystic areas is identified in the RIGHT adnexal region. Patient is status post hysterectomy. Other: No ascites, focal collection or pneumoperitoneum. Musculoskeletal: No acute or suspicious bony abnormalities. Multilevel degenerative changes in the LOWER lumbar spine and LOWER thoracic spine identified. IMPRESSION: 1. No evidence of acute abnormality. 2. Indeterminate 6 x 8 x 4 cm RIGHT adnexal mass. If no prior outside studies are available for comparison, consider ultrasound or MRI with and without contrast for further evaluation. 3.  Aortic Atherosclerosis (ICD10-I70.0). Electronically Signed   By: Margarette Canada M.D.   On: 05/04/2018 18:06   US Pelvic Doppler (torsion R/o Or Mass Arterial Flow)  Result Date:  05/04/2018 CLINICAL DATA:  Initial evaluation for right lower quadrant pain for 1 day, adnexal mass on prior CT. EXAM: TRANSABDOMINAL AND TRANSVAGINAL ULTRASOUND OF PELVIS DOPPLER ULTRASOUND OF OVARIES TECHNIQUE: Both transabdominal and transvaginal ultrasound examinations of the pelvis were performed. Transabdominal technique was performed for global imaging of the pelvis including uterus, ovaries, adnexal regions, and pelvic cul-de-sac. It was necessary to proceed with endovaginal exam following the transabdominal exam to visualize the pelvic structures. Color and duplex Doppler ultrasound was utilized to evaluate blood flow to the ovaries. COMPARISON:  Prior CT from earlier the same day. FINDINGS: Uterus Status post hysterectomy.  No abnormality about the vaginal cuff. Endometrium Surgically absent. Right ovary No definite native ovarian tissue identified. 7.5 x 7.3 x 7.5 cm heterogeneous lesion present within the  right adnexa, corresponding with abnormality seen on prior CT. Lesion demonstrates scattered areas of internal echogenicity with shadowing, likely reflecting calcification. Arterial and venous flow seen within this lesion. Left ovary Not visualized.  No adnexal mass. Pulsed Doppler evaluation of both ovaries demonstrates normal low-resistance arterial and venous waveforms. Other findings No abnormal free fluid. IMPRESSION: 1. 7.5 cm ill-defined shadowing right adnexal mass, corresponding with lesion seen on prior CT. Finding is indeterminate, with differential considerations including a possible solid ovarian neoplasm, although no definite native ovarian tissue identified on this exam. Possible mass arising from the GI tract such is a GIST tumor could also be considered, as this lesion appears to closely approximate several loops of bowel on prior CT. Gynecologic referral for surgical consultation recommended. Additionally, further assessment with dedicated MRI of the pelvis, with and without contrast,  suggested for further characterization. 2. Status post hysterectomy with nonvisualization of the uterus and left ovary. No other acute abnormality within the pelvis. No free fluid. Electronically Signed   By: Jeannine Boga M.D.   On: 05/04/2018 19:29    EKG: Sinus rhythm with nonspecific T wave abnormalities laterally  ASSESSMENT AND PLAN:   1.  Preoperative cardiovascular assessment.  Patient has abnormal ECG, in the absence of chest pain or shortness of breath.  The patient has no prior history of myocardial infarction, congestive heart failure, stroke, diabetes or chronic kidney disease.  The patient is at low, and acceptable risk for surgery.  Recommendations  1.  Agree with current therapy 2.  Defer further cardiac diagnostics at this time 3.  Proceed with surgery as planned  Signed: Isaias Cowman MD,PhD, [facility] 05/06/2018, 1:46 PM

## 2018-05-07 ENCOUNTER — Encounter
Admission: AD | Disposition: A | Discharge status: Home / Self Care | Payer: Self-pay | Source: Home / Self Care | Attending: Obstetrics & Gynecology

## 2018-05-07 ENCOUNTER — Inpatient Hospital Stay: Payer: Medicare Other | Admitting: Certified Registered"

## 2018-05-07 HISTORY — PX: CYSTOSCOPY: SHX5120

## 2018-05-07 HISTORY — PX: LAPAROSCOPIC BILATERAL SALPINGO OOPHERECTOMY: SHX5890

## 2018-05-07 SURGERY — SALPINGO-OOPHORECTOMY, BILATERAL, LAPAROSCOPIC
Anesthesia: General | Site: Bladder

## 2018-05-07 MED ORDER — SUGAMMADEX SODIUM 200 MG/2ML IV SOLN
INTRAVENOUS | Status: AC
  Filled 2018-05-07: qty 2

## 2018-05-07 MED ORDER — ONDANSETRON HCL 4 MG/2ML IJ SOLN
INTRAMUSCULAR | Status: DC | PRN
  Administered 2018-05-07: 4 mg via INTRAVENOUS

## 2018-05-07 MED ORDER — MENTHOL 3 MG MT LOZG
1.0000 | LOZENGE | OROMUCOSAL | Status: DC | PRN
  Filled 2018-05-07: qty 9

## 2018-05-07 MED ORDER — KETOROLAC TROMETHAMINE 30 MG/ML IJ SOLN: 30.0000 mg | Freq: Once | INTRAMUSCULAR | Status: DC

## 2018-05-07 MED ORDER — ONDANSETRON HCL 4 MG/2ML IJ SOLN: 4.0000 mg | Freq: Once | INTRAMUSCULAR | Status: DC | PRN

## 2018-05-07 MED ORDER — FENTANYL CITRATE (PF) 100 MCG/2ML IJ SOLN: 25.0000 ug | INTRAMUSCULAR | Status: DC | PRN

## 2018-05-07 MED ORDER — FENTANYL CITRATE (PF) 100 MCG/2ML IJ SOLN
INTRAMUSCULAR | Status: AC
  Filled 2018-05-07: qty 2

## 2018-05-07 MED ORDER — OXYCODONE HCL 5 MG PO TABS: 5.0000 mg | ORAL_TABLET | ORAL | Status: DC | PRN

## 2018-05-07 MED ORDER — DOCUSATE SODIUM 100 MG PO CAPS
100.0000 mg | ORAL_CAPSULE | Freq: Two times a day (BID) | ORAL | Status: DC
  Administered 2018-05-07 – 2018-05-08 (×2): 100 mg via ORAL
  Filled 2018-05-07 (×2): qty 1

## 2018-05-07 MED ORDER — ONDANSETRON HCL 4 MG/2ML IJ SOLN: 4.0000 mg | Freq: Four times a day (QID) | INTRAMUSCULAR | Status: DC | PRN

## 2018-05-07 MED ORDER — PHENYLEPHRINE HCL 10 MG/ML IJ SOLN
INTRAMUSCULAR | Status: DC | PRN
  Administered 2018-05-07: 100 ug via INTRAVENOUS

## 2018-05-07 MED ORDER — HYDROMORPHONE HCL 1 MG/ML IJ SOLN: 0.2000 mg | INTRAMUSCULAR | Status: DC | PRN

## 2018-05-07 MED ORDER — SIMETHICONE 80 MG PO CHEW: 160.0000 mg | CHEWABLE_TABLET | Freq: Four times a day (QID) | ORAL | Status: DC | PRN

## 2018-05-07 MED ORDER — DEXAMETHASONE SODIUM PHOSPHATE 10 MG/ML IJ SOLN
INTRAMUSCULAR | Status: DC | PRN
  Administered 2018-05-07: 5 mg via INTRAVENOUS

## 2018-05-07 MED ORDER — ROCURONIUM BROMIDE 100 MG/10ML IV SOLN
INTRAVENOUS | Status: DC | PRN
  Administered 2018-05-07: 50 mg via INTRAVENOUS

## 2018-05-07 MED ORDER — GABAPENTIN 300 MG PO CAPS
900.0000 mg | ORAL_CAPSULE | Freq: Every day | ORAL | Status: DC
  Administered 2018-05-07: 900 mg via ORAL
  Filled 2018-05-07: qty 3

## 2018-05-07 MED ORDER — FENTANYL CITRATE (PF) 100 MCG/2ML IJ SOLN
INTRAMUSCULAR | Status: DC | PRN
  Administered 2018-05-07 (×3): 50 ug via INTRAVENOUS

## 2018-05-07 MED ORDER — SODIUM CHLORIDE 0.9 % IV SOLN: 50.0000 mL/h | INTRAVENOUS | Status: DC

## 2018-05-07 MED ORDER — OXYCODONE HCL 5 MG PO TABS: 10.0000 mg | ORAL_TABLET | ORAL | Status: DC | PRN

## 2018-05-07 MED ORDER — ACETAMINOPHEN 500 MG PO TABS
1000.0000 mg | ORAL_TABLET | Freq: Four times a day (QID) | ORAL | Status: DC
  Administered 2018-05-07 – 2018-05-08 (×3): 1000 mg via ORAL
  Filled 2018-05-07 (×3): qty 2

## 2018-05-07 MED ORDER — IBUPROFEN 600 MG PO TABS
600.0000 mg | ORAL_TABLET | Freq: Four times a day (QID) | ORAL | Status: DC
  Administered 2018-05-07 – 2018-05-08 (×4): 600 mg via ORAL
  Filled 2018-05-07 (×4): qty 1

## 2018-05-07 MED ORDER — SUGAMMADEX SODIUM 500 MG/5ML IV SOLN
INTRAVENOUS | Status: DC | PRN
  Administered 2018-05-07: 150 mg via INTRAVENOUS

## 2018-05-07 MED ORDER — PROPOFOL 10 MG/ML IV BOLUS
INTRAVENOUS | Status: DC | PRN
  Administered 2018-05-07: 150 mg via INTRAVENOUS

## 2018-05-07 MED ORDER — LIDOCAINE HCL (CARDIAC) PF 100 MG/5ML IV SOSY
PREFILLED_SYRINGE | INTRAVENOUS | Status: DC | PRN
  Administered 2018-05-07: 100 mg via INTRAVENOUS

## 2018-05-07 MED ORDER — FLUORESCEIN SODIUM 10 % IV SOLN
INTRAVENOUS | Status: AC
  Filled 2018-05-07: qty 5

## 2018-05-07 MED ORDER — FLUORESCEIN SODIUM 10 % IV SOLN
INTRAVENOUS | Status: DC | PRN
  Administered 2018-05-07: 50 mg via INTRAVENOUS

## 2018-05-07 MED ORDER — ONDANSETRON HCL 4 MG PO TABS: 4.0000 mg | ORAL_TABLET | Freq: Four times a day (QID) | ORAL | Status: DC | PRN

## 2018-05-07 SURGICAL SUPPLY — 39 items
ANCHOR TIS RET SYS 235ML (MISCELLANEOUS) ×5 IMPLANT
BAG URINE DRAINAGE (UROLOGICAL SUPPLIES) ×5 IMPLANT
BLADE SURG SZ11 CARB STEEL (BLADE) ×5 IMPLANT
CANISTER SUCT 1200ML W/VALVE (MISCELLANEOUS) ×5 IMPLANT
CATH FOLEY 2WAY  5CC 16FR (CATHETERS) ×2
CATH URTH 16FR FL 2W BLN LF (CATHETERS) ×3 IMPLANT
CHLORAPREP W/TINT 26ML (MISCELLANEOUS) ×5 IMPLANT
COVER WAND RF STERILE (DRAPES) ×5 IMPLANT
DERMABOND ADVANCED (GAUZE/BANDAGES/DRESSINGS) ×4
DERMABOND ADVANCED .7 DNX12 (GAUZE/BANDAGES/DRESSINGS) ×6 IMPLANT
DRAPE LEGGINS SURG 28X43 STRL (DRAPES) ×5 IMPLANT
DRAPE UNDER BUTTOCK W/FLU (DRAPES) ×5 IMPLANT
GLOVE PI ORTHOPRO 6.5 (GLOVE) ×2
GLOVE PI ORTHOPRO STRL 6.5 (GLOVE) ×3 IMPLANT
GLOVE SURG SYN 6.5 ES PF (GLOVE) ×5 IMPLANT
GOWN STRL REUS W/ TWL LRG LVL3 (GOWN DISPOSABLE) ×6 IMPLANT
GOWN STRL REUS W/ TWL XL LVL3 (GOWN DISPOSABLE) ×3 IMPLANT
GOWN STRL REUS W/TWL LRG LVL3 (GOWN DISPOSABLE) ×4
GOWN STRL REUS W/TWL XL LVL3 (GOWN DISPOSABLE) ×2
IRRIGATION STRYKERFLOW (MISCELLANEOUS) ×3 IMPLANT
IRRIGATOR STRYKERFLOW (MISCELLANEOUS) ×5
IV LACTATED RINGERS 1000ML (IV SOLUTION) ×5 IMPLANT
KIT PINK PAD W/HEAD ARE REST (MISCELLANEOUS) ×5
KIT PINK PAD W/HEAD ARM REST (MISCELLANEOUS) ×3 IMPLANT
KIT TURNOVER CYSTO (KITS) ×5 IMPLANT
LABEL OR SOLS (LABEL) IMPLANT
LIGASURE VESSEL 5MM BLUNT TIP (ELECTROSURGICAL) ×5 IMPLANT
NS IRRIG 500ML POUR BTL (IV SOLUTION) ×5 IMPLANT
PACK LAP CHOLECYSTECTOMY (MISCELLANEOUS) ×5 IMPLANT
PAD OB MATERNITY 4.3X12.25 (PERSONAL CARE ITEMS) ×5 IMPLANT
PAD PREP 24X41 OB/GYN DISP (PERSONAL CARE ITEMS) ×5 IMPLANT
POUCH SPECIMEN RETRIEVAL 10MM (ENDOMECHANICALS) ×5 IMPLANT
SLEEVE ENDOPATH XCEL 5M (ENDOMECHANICALS) ×10 IMPLANT
SUT MNCRL 4-0 (SUTURE) ×2
SUT MNCRL 4-0 27XMFL (SUTURE) ×3
SUTURE MNCRL 4-0 27XMF (SUTURE) ×3 IMPLANT
TROCAR ENDO BLADELESS 11MM (ENDOMECHANICALS) ×5 IMPLANT
TROCAR XCEL NON-BLD 5MMX100MML (ENDOMECHANICALS) ×5 IMPLANT
TUBING INSUFFLATION (TUBING) ×5 IMPLANT

## 2018-05-07 NOTE — Op Note (Addendum)
05/07/2018  PATIENT:  Debra Joseph  67 y.o. female  PRE-OPERATIVE DIAGNOSIS:  Ovarian Mass and Pelvic Pain  POST-OPERATIVE DIAGNOSIS:  right ovarian torsion,right ovarian mass  PROCEDURE:  Procedure(s): LAPAROSCOPIC BILATERAL SALPINGO OOPHORECTOMY (Bilateral) CYSTOSCOPY (N/A)  SURGEON:  Surgeon(s) and Role:    * Ward, Honor Loh, MD - Primary  ANESTHESIA: GET  EBL:  Minimal IN/OUT:  See flowsheet DRAINS: foley to gravity, removed at conclusion of case  SPECIMEN:  1. Right torsed ovary with solid masses within, fallopian tube  2. Left tube and ovary 3. Filmy adhesions of small bowel to pelvic floor/bladder.  DISPOSITION OF SPECIMEN:  To pathology  COUNTS: correct x2  COMPLICATIONS: none apparent  PATIENT DISPOSITION:  VS stable to PACU   Indication for surgery:  Patient presented to ED with sudden onset RLQ pain and on CT was found to have a 7cm right adnexal mass, but was sent home. She followed up the next day and was immediately admitted for pain control to await surgery that could not be done due to schedule.     Discussion of surgical options were also reviewed, including clips, fulguration, and partial salpingectomy, as well as their limitations of possible failure, ectopic pregnancy, retention of fimbriated ends which could become cancerous, and post-tubal pain syndrome.  Patient elected to have her tubes removed in their entirety to eliminate these risks.  She understands that reversal would not be possible after this surgery, and requests to proceed.   Procedure: The patient was brought to the OR and identified as Debra Joseph.  She was given general anesthesia via endotracheal route, positioned supine, and prepped and draped in the usual sterile fashion. A foley catheter was placed. A surgical time-out was called.  A periumbilical incision was made, and using the visiport method, the 6mm trochar was inserted. Opening pressure was 2mmHg. Pneumoperitoneum was created  to 20mmHg. Brief survey of the abdomen was performed and appeared normal.  A 58mm trochar was placed in the right and lower quadrants under visualization.    The right ovary was purple and necrotic, and the bowel was unable to fully be retracted from the pelvis.  The patient was airplaned to the left and the ovary and IP ligament were able to be manipulated without threat of bowel injury. The Ligasure was inserted and used to seal and divide the IP ligament and subsequent peritoneal tissues, transecting the torsed ovary and tube from the pelvic side wall.  The pedicle was hemostatic.  The patient was then airplaned to the right, exposing the normal left tube and ovary, and this IP ligament was cauterized and transected, and the ovary and tube removed from the left pelvic side wall.  The 74mm umbilical incision was enlarged to 61mm and an endocatch bag was inserted and the two tube/ovary complexes were placed into the bag.  Upon closure of the bag, the instrument was defective and did not close properly.  Another endocatch bag was then inserted and the ovaries placed within. The 67EH umbilical incision was made bigger and the ovary and contained clots were removed in pieces.  The last portion of the solid component of the right ovary was being retracted and again the endocatch bag broke.  A different endoscopic bag was then used and the ovary was removed.  These specimen were sent to pathology.  The pneumoperitoneum was then recreated and all operative sites were hemostatic.   Due to bowel placement, the ureters were not able to be satisfactorily visualized.  The attention was turned to the bladder.  A 70-degree cystoscope was inserted into the urethra after the foley was removed.  The bladder was inflated with 300cc of normal saline, and inspection of the bladder wall was performed.  Bilateral ureteral jets were observed.  The cystoscope was removed.  The pneumoperitoneum was deflated and trochars removed.  The  umbilical fascia was closed with a running stitch of 0-vicryl.  The skin incisions were reapproximated with 4-0 monocryl.  The skin was then closed with sugical glue.    The patient tolerated the procedure, the sponge, needle, and instrument counts were correct x2, and the patient was brought to PACU extubated, and in a stable condition.  I was present for, and performed this procedure in its entirety.  Due to the difficulty with visualization and the issues with the instruments, this case took longer than the typical laparoscopic bilateral salpingo-oophorectomy.  47 additional minutes were added to this case to ensure its safe completion.  ----- Larey Days, MD Attending Obstetrician and Gynecologist [facility], Department of [facility]

## 2018-05-07 NOTE — Progress Notes (Signed)
Obstetric and Gynecology  HD 3  Subjective   Still having pain in RLQ requiring IV dilaudid.  Slept well overnight. Ready for surgery.  Has been NPO since midnight.  Objective  Objective:   Vitals:   05/07/18 0217 05/07/18 0845 05/07/18 0856 05/07/18 1108  BP:  119/62  128/64  Pulse:  69  73  Resp:  18  18  Temp:  98.2 F (36.8 C)  99.1 F (37.3 C)  TempSrc:  Oral  Oral  SpO2: 95% 96% 97% 92%  Weight:      Height:       General: NAD Cardiovascular: RRR, no murmurs Pulmonary: CTAB, normal respiratory effort Abdomen: tender, +BS Extremities: No erythema or cords, no calf tenderness, with normal peripheral pulses.   Assessment   67 y.o. Hospital Day: 3 with suspected ovarian torsion and delayed surgery from yesterday per anesthesia and OR staff  Plan   1. Cardiac and medical clearance obtained yesterday,  2. To OR when ready, hopefully.    ----- Larey Days, MD Attending Obstetrician and Gynecologist [facility], Department of [facility]

## 2018-05-07 NOTE — Care Management Important Message (Signed)
Copy of signed IM left with patient in room.  

## 2018-05-07 NOTE — Progress Notes (Addendum)
Transported to OR via bed by orderly at 1215 on 05/07/18. Reed Breech, RN 05/07/2018

## 2018-05-07 NOTE — Transfer of Care (Signed)
Immediate Anesthesia Transfer of Care Note  Patient: Debra Joseph  Procedure(s) Performed: LAPAROSCOPIC BILATERAL SALPINGO OOPHORECTOMY (Bilateral Abdomen) CYSTOSCOPY (N/A Bladder)  Patient Location: PACU  Anesthesia Type:General  Level of Consciousness: awake and alert   Airway & Oxygen Therapy: Patient Spontanous Breathing and Patient connected to face mask oxygen  Post-op Assessment: Report given to RN and Post -op Vital signs reviewed and stable  Post vital signs: Reviewed and stable  Last Vitals:  Vitals Value Taken Time  BP 133/74 05/07/2018  3:51 PM  Temp 37.1 C 05/07/2018  3:51 PM  Pulse 82 05/07/2018  3:54 PM  Resp 16 05/07/2018  3:54 PM  SpO2 98 % 05/07/2018  3:54 PM  Vitals shown include unvalidated device data.  Last Pain:  Vitals:   05/07/18 1551  TempSrc:   PainSc: Asleep      Patients Stated Pain Goal: 0 (34/35/68 [id number])  Complications: No apparent anesthesia complications

## 2018-05-07 NOTE — Progress Notes (Signed)
Pt is cleared by cardiology for the surgery. As per our discussion with Gyn team, We will sign off for now. If any further needs arise, please call the consult again.

## 2018-05-07 NOTE — Anesthesia Procedure Notes (Signed)
Procedure Name: Intubation Date/Time: 05/07/2018 1:21 PM Performed by: Zetta Bills, CRNA Pre-anesthesia Checklist: Patient identified Patient Re-evaluated:Patient Re-evaluated prior to induction Oxygen Delivery Method: Circle system utilized Preoxygenation: Pre-oxygenation with 100% oxygen Induction Type: IV induction Ventilation: Mask ventilation without difficulty Laryngoscope Size: Mac and 3 Grade View: Grade III Tube type: Oral Tube size: 7.0 mm Number of attempts: 2 Airway Equipment and Method: Stylet Placement Confirmation: ETT inserted through vocal cords under direct vision Secured at: 21 cm Tube secured with: Tape Dental Injury: Teeth and Oropharynx as per pre-operative assessment

## 2018-05-07 NOTE — Anesthesia Post-op Follow-up Note (Signed)
Anesthesia QCDR form completed.        

## 2018-05-07 NOTE — Progress Notes (Signed)
Transported back to room via bed by PACU RN at 1657 on 05/07/18. Reed Breech, RN 05/07/2018

## 2018-05-08 ENCOUNTER — Encounter: Payer: Self-pay | Admitting: Obstetrics & Gynecology

## 2018-05-08 LAB — BASIC METABOLIC PANEL
ANION GAP: 7 (ref 5–15)
BUN: 6 mg/dL — AB (ref 8–23)
CO2: 28 mmol/L (ref 22–32)
Calcium: 8.1 mg/dL — ABNORMAL LOW (ref 8.9–10.3)
Chloride: 103 mmol/L (ref 98–111)
Creatinine, Ser: 0.83 mg/dL (ref 0.44–1.00)
GFR calc Af Amer: >60 mL/min (ref >60)
GFR calc non Af Amer: >60 mL/min (ref >60)
Glucose, Bld: 124 mg/dL — ABNORMAL HIGH (ref 70–99)
Potassium: 3.1 mmol/L — ABNORMAL LOW (ref 3.5–5.1)
Sodium: 138 mmol/L (ref 135–145)

## 2018-05-08 LAB — CBC
HCT: 35 % — ABNORMAL LOW (ref 36.0–46.0)
Hemoglobin: 11.3 g/dL — ABNORMAL LOW (ref 12.0–15.0)
MCH: 30.9 pg (ref 26.0–34.0)
MCHC: 32.3 g/dL (ref 30.0–36.0)
MCV: 95.6 fL (ref 80.0–100.0)
NRBC: 0 % (ref 0.0–0.2)
PLATELETS: 283 10*3/uL (ref 150–400)
RBC: 3.66 MIL/uL — ABNORMAL LOW (ref 3.87–5.11)
RDW: 12.6 % (ref 11.5–15.5)
WBC: 13.2 10*3/uL — AB (ref 4.0–10.5)

## 2018-05-08 MED ORDER — ACETAMINOPHEN 500 MG PO TABS: 1000.0000 mg | ORAL_TABLET | Freq: Four times a day (QID) | ORAL | 0 refills | Status: AC

## 2018-05-08 MED ORDER — IBUPROFEN 600 MG PO TABS: 600.0000 mg | ORAL_TABLET | Freq: Four times a day (QID) | ORAL | 0 refills | Status: DC

## 2018-05-08 MED ORDER — METHYLPREDNISOLONE 4 MG PO TBPK: ORAL_TABLET | Freq: Four times a day (QID) | ORAL | 0 refills | Status: DC

## 2018-05-08 MED ORDER — BECLOMETHASONE DIPROP HFA 40 MCG/ACT IN AERB: 2.0000 | INHALATION_SPRAY | Freq: Two times a day (BID) | RESPIRATORY_TRACT | 0 refills | Status: AC

## 2018-05-08 NOTE — Anesthesia Postprocedure Evaluation (Signed)
Anesthesia Post Note  Patient: Debra Joseph  Procedure(s) Performed: LAPAROSCOPIC BILATERAL SALPINGO OOPHORECTOMY (Bilateral Abdomen) CYSTOSCOPY (N/A Bladder)  Patient location during evaluation: PACU Anesthesia Type: General Level of consciousness: awake and alert and oriented Pain management: pain level controlled Vital Signs Assessment: post-procedure vital signs reviewed and stable Respiratory status: spontaneous breathing Cardiovascular status: blood pressure returned to baseline Anesthetic complications: no     Last Vitals:  Vitals:   05/08/18 0201 05/08/18 0301  BP:  93/62  Pulse:  63  Resp:  18  Temp:  36.7 C  SpO2: 93% 96%    Last Pain:  Vitals:   05/08/18 0316  TempSrc:   PainSc: Asleep                 Mykael Trott

## 2018-05-08 NOTE — Discharge Summary (Signed)
Gynecology Physician Postoperative Discharge Summary  Patient ID: KRISTAIN FILO MRN: [id number] DOB/AGE: 12-08-1950 67 y.o.  Admit Date: 05/05/2018 Discharge Date: 05/08/2018  Preoperative Diagnoses: right adnexal mass, ovarian torsion, bronchitis Postoperative diagnoses: same  Procedures: Procedure(s) (LRB): LAPAROSCOPIC BILATERAL SALPINGO OOPHORECTOMY (Bilateral) CYSTOSCOPY (N/A) -chest xray -EKG -Echocardiogram -nebulizer treatements   CBC Latest Ref Rng & Units 05/08/2018 05/05/2018 05/04/2018  WBC 4.0 - 10.5 K/uL 13.2(H) 18.5(H) 18.7(H)  Hemoglobin 12.0 - 15.0 g/dL 11.3(L) 13.4 14.1  Hematocrit 36.0 - 46.0 % 35.0(L) 40.5 43.7  Platelets 150 - 400 K/uL 283 382 385    Hospital Course:  ELLY HAFFEY is a 67 y.o. admitted for pain control and scheduled for surgery due to ovarian torsion and pain control.  She was scheduled for surgery the following day and was delayed due to anesthesia concern for bronchitis - after medical clearance and cardiac clearance, she was again delayed by anesthesia and the OR.  She was then scheduled again the following day.  She underwent the procedures as mentioned above, her operation was uncomplicated. For further details about surgery, please refer to the operative report. Patient had an uncomplicated postoperative course. By time of discharge on POD#1, her pain was controlled on oral pain medications; she was ambulating, voiding without difficulty, tolerating regular diet and passing flatus. She was deemed stable for discharge to home.   Discharge Exam: Blood pressure (!) 90/55, pulse 70, temperature 98.7 F (37.1 C), temperature source Oral, resp. rate 20, height 5\' 3"  (1.6 m), weight 88 kg, SpO2 96 %. General appearance: alert and no distress  Resp: clear to auscultation bilaterally, normal respiratory effort Cardio: regular rate and rhythm  GI: soft, non-tender; bowel sounds normal; no masses, no organomegaly.  Incision: C/D/I, no erythema, no  drainage noted, ecchymosis surrounding umbilical incision  Extremities: extremities normal, atraumatic, no cyanosis or edema and Homans sign is negative, no sign of DVT  Discharged Condition: Stable To follow up with PCP for pulmonary improvement.  Disposition: Discharge disposition: 01-Home or Self Care       Discharge Instructions    Diet - low sodium heart healthy   Complete by:  As directed    Increase activity slowly   Complete by:  As directed      Allergies as of 05/08/2018      Reactions   Other Other (See Comments)   DARVOCET      Medication List    STOP taking these medications   amoxicillin 875 MG tablet Commonly known as:  AMOXIL   RA NAPROXEN SODIUM 220 MG tablet Generic drug:  naproxen sodium     TAKE these medications   acetaminophen 500 MG tablet Commonly known as:  TYLENOL Take 2 tablets (1,000 mg total) by mouth every 6 (six) hours. No more than 4000mg  of acetaminophen per day What changed:    when to take this  reasons to take this  additional instructions   anastrozole 1 MG tablet Commonly known as:  ARIMIDEX TAKE 1 TABLET(1 MG) BY MOUTH DAILY   beclomethasone 40 MCG/ACT inhaler Commonly known as:  QVAR Inhale 2 puffs into the lungs 2 (two) times daily.   CENTRUM ULTRA WOMENS PO Take 1 tablet by mouth daily.   diphenhydrAMINE 25 MG tablet Commonly known as:  BENADRYL Take 25 mg by mouth every 6 (six) hours as needed.   ibuprofen 600 MG tablet Commonly known as:  ADVIL,MOTRIN Take 1 tablet (600 mg total) by mouth every 6 (six) hours.  methylPREDNISolone 4 MG Tbpk tablet Commonly known as:  MEDROL DOSEPAK Take by mouth taper from 4 doses each day to 1 dose and stop. Take 4 x 3 days, 3 x 2 days, then 2 x 1 day, then 1   ondansetron 4 MG tablet Commonly known as:  ZOFRAN Take 1 tablet (4 mg total) by mouth daily as needed.   oxyCODONE-acetaminophen 5-325 MG tablet Commonly known as:  PERCOCET/ROXICET Take 1 tablet by mouth  every 4 (four) hours as needed.      Follow-up Information    Ward, Honor Loh, MD Follow up in 2 week(s).   Specialty:  Obstetrics and Gynecology Why:  for routine postop check Contact information: [place] [address] [phone]        Baxter Hire, MD Follow up in 2 week(s).   Specialty:  Internal Medicine Why:  if no improvment of pulmonary symptoms Contact information: [place] [address] [phone]        Lloyd Huger, MD Follow up.   Specialty:  Oncology Why:  as scheduled  Contact information: [address] [place] [address] [phone]           Signed:  [place] Attending [facility]

## 2018-05-08 NOTE — Discharge Instructions (Signed)
Discharge instructions:  Call office if you have any of the following: fever >101 F, chills, shortness of breath, excessive vaginal bleeding, incision drainage or problems, leg pain or redness, or any other concerns.   Activity: Do not lift > 10 lbs for 4 weeks.   No driving for 1-2 weeks.   You may feel some pain in your upper right abdomen/rib and right shoulder.  This is from the gas in the abdomen for surgery. This will subside over time, please be patient!  Take 600mg  Ibuprofen and 1000mg  Tylenol around the clock, every 6 hours for at least the first 3-5 days.  After this you can take as needed.  This will help decrease inflammation and promote healing.  The narcotics you'll take just as needed, as they just trick your brain into thinking its not in pain.    Please don't limit yourself in terms of routine activity.  You will be able to do most things, although they may take longer to do or be a little painful.  You can do it!  Don't be a hero, but don't be a wimp either!   No intercourse, tampons, or douching for 6 weeks.  No tub baths- showers only.

## 2018-05-08 NOTE — Progress Notes (Signed)
Discharge instructions provided.  Pt verbalizes understanding of all instructions and follow-up care.  Pt discharged to home at 1304 on 05/08/18 via wheelchair by RN. Reed Breech, RN 05/08/2018

## 2018-05-12 LAB — SURGICAL PATHOLOGY

## 2018-08-31 ENCOUNTER — Other Ambulatory Visit: Payer: Self-pay | Admitting: Oncology

## 2018-09-20 ENCOUNTER — Ambulatory Visit
Admission: RE | Admit: 2018-09-20 | Discharge: 2018-09-20 | Disposition: A | Discharge status: Home / Self Care | Payer: Medicare Other | Source: Ambulatory Visit | Attending: Oncology | Admitting: Oncology

## 2018-09-20 ENCOUNTER — Other Ambulatory Visit: Payer: Self-pay

## 2018-09-20 DIAGNOSIS — Z170 Estrogen receptor positive status [ER+]: Secondary | ICD-10-CM | POA: Insufficient documentation

## 2018-09-20 DIAGNOSIS — C50412 Malignant neoplasm of upper-outer quadrant of left female breast: Secondary | ICD-10-CM | POA: Insufficient documentation

## 2018-10-03 NOTE — Progress Notes (Signed)
Debra Joseph  [phone] [phone]) [phone]  ID: MC HOLLEN OB: 08-Mar-1951  MR#: [id number]  XNT#:[id number]  Patient Care Team: Baxter Hire, MD as PCP - General (Internal Medicine)  I connected with Debra Joseph on 10/05/18 at 10:00 AM EDT by video enabled telemedicine visit and verified that I am speaking with the correct person using two identifiers.   I discussed the limitations, risks, security and privacy concerns of performing an evaluation and management service by telemedicine and the availability of in-person appointments. I also discussed with the patient that there may be a patient responsible charge related to this service. The patient expressed understanding and agreed to proceed.   Other persons participating in the visit and their role in the encounter: Patient, MD  Patient's location: Home Provider's location: Clinic  CHIEF COMPLAINT: Pathologic stage Ia ER/PR positive, HER-2 negative adenocarcinoma of the upper outer quadrant of left breast. Oncotype DX score 13 which is low risk.  INTERVAL HISTORY: Patient agreed to video enabled telemedicine visit for her routine 69-monthevaluation.  She currently feels well and is asymptomatic, although admits to occasional left breast pain which was worse after her recent mammogram.  She is tolerating anastrozole well without significant side effects. She has no neurologic complaints. She denies any recent fevers or illnesses. She denies any pain. She denies any chest pain or shortness of breath.  She denies any nausea, vomiting, constipation, or diarrhea. She has no urinary complaints.  Patient offers no further specific complaints today.  REVIEW OF SYSTEMS:   Review of Systems  Constitutional: Negative.  Negative for fever, malaise/fatigue and weight loss.  Respiratory: Negative.  Negative for cough and sputum production.   Cardiovascular: Negative.  Negative for chest pain and leg swelling.   Gastrointestinal: Negative.  Negative for abdominal pain.  Genitourinary: Negative.  Negative for dysuria.  Musculoskeletal: Negative.  Negative for back pain.  Skin: Negative.  Negative for rash.  Neurological: Negative.  Negative for sensory change, focal weakness, weakness and headaches.  Psychiatric/Behavioral: Negative.  The patient is not nervous/anxious.     As per HPI. Otherwise, a complete review of systems is negative.  PAST MEDICAL HISTORY: Past Medical History:  Diagnosis Date  . Anemia   . Arthritis   . [place] [place]   Left breast cancer Invasive mammary carcinoma  . [place] [place]   COLON CA  . [place] cancer [place]   . Dysrhythmia    PALPITATIONS  . Endometriosis   . Family history of adverse reaction to anesthesia    MOM-NAUSEATED  . Fibroids   . Heart murmur   . Hepatitis B    AGE 68  . Hepatitis C 1999   NO CIRRHOSIS  . Personal history of radiation therapy 2017   Left breast  . Seasonal allergies     PAST SURGICAL HISTORY: Past Surgical History:  Procedure Laterality Date  . ABDOMINAL HYSTERECTOMY     pt states hemorrhage during hysterectomy but had never had any problems with prior surgery  . BREAST BIOPSY Left 08/09/2015   1:00 7 cmfn coil marker, benign adipose tissue  . BREAST BIOPSY Left 08/09/2015   1:00 6 cmfn, wing marker, Invasive mammary carcinoma  . [facility]  . COLONOSCOPY     MULTIPLE  . CYSTOSCOPY N/A 05/07/2018   Procedure: CYSTOSCOPY;  Surgeon: Ward, CHonor Loh MD;  Location: [facility] ORS;  Service: Gynecology;  Laterality: N/A;  . EXCISION OF BREAST LESION Left 09/04/2015   Procedure: EXCISION OF SKIN LESION @ AREOLA LEFT BREAST;  Surgeon: Leonie Green, MD;  Location: [facility] ORS;  Service: General;  Laterality: Left;  . LAPAROSCOPIC BILATERAL SALPINGO OOPHERECTOMY Bilateral 05/07/2018   Procedure: LAPAROSCOPIC BILATERAL SALPINGO OOPHORECTOMY;  Surgeon:  Ward, Honor Loh, MD;  Location: [facility] ORS;  Service: Gynecology;  Laterality: Bilateral;  . MASTECTOMY W/ SENTINEL NODE BIOPSY Left 09/04/2015   Procedure: MASTECTOMY WITH SENTINEL LYMPH NODE BIOPSY;  Surgeon: Leonie Green, MD;  Location: [facility] ORS;  Service: General;  Laterality: Left;  . PARTIAL MASTECTOMY WITH NEEDLE LOCALIZATION Left 09/04/2015   Procedure: PARTIAL MASTECTOMY WITH PREOP XRAY NEEDLE LOCALIZATION;  Surgeon: Leonie Green, MD;  Location: [facility] ORS;  Service: General;  Laterality: Left;  . TUBAL LIGATION      FAMILY HISTORY Family History  Problem Relation Age of Onset  . Liver cancer Mother   . Colon cancer Maternal Grandmother   . Breast cancer Cousin        ADVANCED DIRECTIVES:    HEALTH MAINTENANCE: Social History   Tobacco Use  . Smoking status: Former Smoker    Packs/day: 0.50    Years: 35.00    Pack years: 17.50    Types: E-cigarettes  . Smokeless tobacco: Former Systems developer    Quit date: 08/19/2012  . Tobacco comment: VAPOR CIG   Substance Use Topics  . Alcohol use: No  . Drug use: No     Colonoscopy:  PAP:  Bone density:  Lipid panel:  Allergies  Allergen Reactions  . Other Other (See Comments)    DARVOCET    Current Outpatient Medications  Medication Sig Dispense Refill  . acetaminophen (TYLENOL) 500 MG tablet Take 2 tablets (1,000 mg total) by mouth every 6 (six) hours. No more than 4024m of acetaminophen per day 30 tablet 0  . anastrozole (ARIMIDEX) 1 MG tablet TAKE 1 TABLET(1 MG) BY MOUTH DAILY 90 tablet 3  . diphenhydrAMINE (BENADRYL) 25 MG tablet Take 25 mg by mouth every 6 (six) hours as needed.    .Marland KitchenPARoxetine (PAXIL) 10 MG tablet Take 1 tablet by mouth 1 day or 1 dose.    . beclomethasone (QVAR REDIHALER) 40 MCG/ACT inhaler Inhale 2 puffs into the lungs 2 (two) times daily. (Patient not taking: Reported on 10/05/2018) 1 Inhaler 0  . Multiple Vitamins-Minerals (CENTRUM ULTRA WOMENS PO) Take 1 tablet by mouth daily.      No  current facility-administered medications for this visit.     OBJECTIVE: There were no vitals filed for this visit.   There is no height or weight on file to calculate BMI.    ECOG FS:0 - Asymptomatic  General: Well-developed, well-nourished, no acute distress. HEENT: Normocephalic, moist mucous membranes. Neuro: Alert, answering all questions appropriately. Cranial nerves grossly intact. Skin: No rashes or petechiae noted. Psych: Normal affect.  LAB RESULTS:  Lab Results  Component Value Date   NA 138 05/08/2018   K 3.1 (L) 05/08/2018   CL 103 05/08/2018   CO2 28 05/08/2018   GLUCOSE 124 (H) 05/08/2018   BUN 6 (L) 05/08/2018   CREATININE 0.83 05/08/2018   CALCIUM 8.1 (L) 05/08/2018   PROT 7.7 05/05/2018   ALBUMIN 4.1 05/05/2018   AST 36 05/05/2018   ALT 15 05/05/2018   ALKPHOS 63 05/05/2018   BILITOT 0.8 05/05/2018   GFRNONAA >60 05/08/2018   GFRAA >60 05/08/2018    Lab Results  Component Value  Date   WBC 13.2 (H) 05/08/2018   NEUTROABS 15.3 (H) 05/05/2018   HGB 11.3 (L) 05/08/2018   HCT 35.0 (L) 05/08/2018   MCV 95.6 05/08/2018   PLT 283 05/08/2018     STUDIES: Mm Diag Breast Tomo Bilateral  Result Date: 09/20/2018 CLINICAL DATA:  Malignant lumpectomy of the UPPER OUTER QUADRANT of the LEFT breast in April, 2017, pathology invasive mammary carcinoma of no special type.Adjuvant radiation therapy. Patient currently undergoing hormonal chemoprevention with anastrozole. Annual evaluation. EXAM: DIGITAL DIAGNOSTIC BILATERAL MAMMOGRAM WITH CAD AND TOMO COMPARISON:  Previous exam(s). ACR Breast Density Category c: The breast tissue is heterogeneously dense, which may obscure small masses. FINDINGS: Tomosynthesis and synthesized full field CC and MLO views of both breasts were obtained. Standard spot magnification MLO view of the lumpectomy site in the LEFT breast was also obtained. Post surgical scar/architectural distortion at the lumpectomy site in the UPPER OUTER LEFT  breast at MIDDLE depth. New likely benign dystrophic calcifications associated with an oil cyst at the lumpectomy site. No new or suspicious findings elsewhere in the LEFT breast. No findings suspicious for malignancy in the RIGHT breast. Mammographic images were processed with CAD. IMPRESSION: 1. New likely benign dystrophic calcifications associated with an oil cyst at the lumpectomy site in the UPPER OUTER LEFT breast. 2. No mammographic evidence of malignancy involving the RIGHT breast. RECOMMENDATION: Diagnostic LEFT mammogram with spot magnification views of the likely benign LEFT breast calcifications at the lumpectomy site in 6 months. I have discussed the findings and recommendations with the patient. If applicable, a reminder letter will be sent to the patient regarding the next appointment. BI-RADS CATEGORY  3: Probably benign. Electronically Signed   By: Evangeline Dakin M.D.   On: 09/20/2018 11:42    ASSESSMENT: Pathologic stage Ia ER/PR positive, HER-2 negative adenocarcinoma of the upper outer quadrant of left breast. Oncotype DX score 13 which is low risk.  PLAN:    1. Pathologic stage Ia ER/PR positive, HER-2 negative adenocarcinoma of the upper outer quadrant of left breast:  Patient had a low risk Oncotype score therefore adjuvant chemotherapy was not necessary. She completed adjuvant XRT. Patient could not tolerate letrozole, therefore she was switched to anastrozole and is tolerating this well.  Continue anastrozole for a total of 5 years completing in July 2022.  Her most recent mammogram on September 20, 2018 was reported as BI-RADS 3 and recommendation was to repeat unilateral left breast diagnostic mammogram in 6 months.  Patient will return to clinic 1 to 2 days after this mammogram to discuss the results.   2.  Osteopenia: Bone mineral density completed on February 24, 2018 reported T score of -2.0 which is slightly worse than 1 year prior when the reported T score was -1.7.  Continue  calcium and vitamin D supplementation.  Consider Fosamax in the future if her bone mineral density continues to decline.  Will repeat bone mineral density along with mammogram in 6 months as above.   I provided 20 minutes of face-to-face video visit time during this encounter, and > 50% was spent counseling as documented under my assessment & plan.   Patient expressed understanding and was in agreement with this plan. She also understands that She can call clinic at any time with any questions, concerns, or complaints.    Breast cancer, left [facility])   Staging form: Breast, AJCC 7th Edition     Clinical stage from 09/02/2015: Stage IA (T1c, N0, M0) - Signed by Kathlene November  Grayland Ormond, MD on 09/02/2015   Lloyd Huger, MD   10/05/2018 1:27 PM

## 2018-10-05 ENCOUNTER — Encounter: Payer: Self-pay | Admitting: Oncology

## 2018-10-05 ENCOUNTER — Inpatient Hospital Stay: Payer: Medicare Other | Attending: Oncology | Admitting: Oncology

## 2018-10-05 ENCOUNTER — Other Ambulatory Visit: Payer: Self-pay

## 2018-10-05 DIAGNOSIS — M8580 Other specified disorders of bone density and structure, unspecified site: Secondary | ICD-10-CM

## 2018-10-05 DIAGNOSIS — C50412 Malignant neoplasm of upper-outer quadrant of left female breast: Secondary | ICD-10-CM | POA: Diagnosis not present

## 2018-10-05 DIAGNOSIS — Z87891 Personal history of nicotine dependence: Secondary | ICD-10-CM

## 2018-10-05 DIAGNOSIS — Z170 Estrogen receptor positive status [ER+]: Secondary | ICD-10-CM | POA: Diagnosis not present

## 2018-10-05 NOTE — Progress Notes (Signed)
Patient stated that she had been doing well. Patient would like to know what the plan will be. Patient stated that she had been having some pain on her left breast after her mammogram. Patient does her every other monthly self-breast exam. Patient denied nipple discharge or skin discoloration.

## 2018-12-29 ENCOUNTER — Ambulatory Visit: Payer: Medicare Other | Admitting: Radiation Oncology

## 2019-03-23 ENCOUNTER — Ambulatory Visit
Admission: RE | Admit: 2019-03-23 | Discharge: 2019-03-23 | Disposition: A | Discharge status: Home / Self Care | Payer: Medicare Other | Source: Ambulatory Visit | Attending: Oncology | Admitting: Oncology

## 2019-03-23 ENCOUNTER — Other Ambulatory Visit: Payer: Medicare Other

## 2019-03-23 DIAGNOSIS — Z13820 Encounter for screening for osteoporosis: Secondary | ICD-10-CM | POA: Diagnosis not present

## 2019-03-23 DIAGNOSIS — Z170 Estrogen receptor positive status [ER+]: Secondary | ICD-10-CM

## 2019-03-23 DIAGNOSIS — C50412 Malignant neoplasm of upper-outer quadrant of left female breast: Secondary | ICD-10-CM

## 2019-03-23 DIAGNOSIS — Z853 Personal history of malignant neoplasm of breast: Secondary | ICD-10-CM | POA: Insufficient documentation

## 2019-03-23 DIAGNOSIS — Z780 Asymptomatic menopausal state: Secondary | ICD-10-CM | POA: Diagnosis not present

## 2019-03-27 NOTE — Progress Notes (Deleted)
Debra Joseph  [phone] Fax:(336) [phone]  ID: Debra Joseph OB: 1951/04/25  MR#: [id number]  KNL#:[id number]  Patient Care Team: Baxter Hire, MD as PCP - General (Internal Medicine)  I connected with Debra Joseph on 03/27/19 at 10:30 AM EDT by video enabled telemedicine visit and verified that I am speaking with the correct person using two identifiers.   I discussed the limitations, risks, security and privacy concerns of performing an evaluation and management service by telemedicine and the availability of in-person appointments. I also discussed with the patient that there may be a patient responsible charge related to this service. The patient expressed understanding and agreed to proceed.   Other persons participating in the visit and their role in the encounter: Patient, MD  Patients location: Home Providers location: Clinic  CHIEF COMPLAINT: Pathologic stage Ia ER/PR positive, HER-2 negative adenocarcinoma of the upper outer quadrant of left breast. Oncotype DX score 13 which is low risk.  INTERVAL HISTORY: Patient agreed to video enabled telemedicine visit for her routine 51-monthevaluation.  She currently feels well and is asymptomatic, although admits to occasional left breast pain which was worse after her recent mammogram.  She is tolerating anastrozole well without significant side effects. She has no neurologic complaints. She denies any recent fevers or illnesses. She denies any pain. She denies any chest pain or shortness of breath.  She denies any nausea, vomiting, constipation, or diarrhea. She has no urinary complaints.  Patient offers no further specific complaints today.  REVIEW OF SYSTEMS:   Review of Systems  Constitutional: Negative.  Negative for fever, malaise/fatigue and weight loss.  Respiratory: Negative.  Negative for cough and sputum production.   Cardiovascular: Negative.  Negative for chest pain and leg swelling.    Gastrointestinal: Negative.  Negative for abdominal pain.  Genitourinary: Negative.  Negative for dysuria.  Musculoskeletal: Negative.  Negative for back pain.  Skin: Negative.  Negative for rash.  Neurological: Negative.  Negative for sensory change, focal weakness, weakness and headaches.  Psychiatric/Behavioral: Negative.  The patient is not nervous/anxious.     As per HPI. Otherwise, a complete review of systems is negative.  PAST MEDICAL HISTORY: Past Medical History:  Diagnosis Date   Anemia    Arthritis    [place] [place]   Left breast cancer Invasive mammary carcinoma   [place]   COLON CA   [place] [place]   cancerous polyp removed   Dysrhythmia    PALPITATIONS   Endometriosis    Family history of adverse reaction to anesthesia    MOM-NAUSEATED   Fibroids    Heart murmur    Hepatitis B    AGE 67   Hepatitis C 1999   NO CIRRHOSIS   Personal history of radiation therapy 2017   Left breast   Seasonal allergies     PAST SURGICAL HISTORY: Past Surgical History:  Procedure Laterality Date   ABDOMINAL HYSTERECTOMY     pt states hemorrhage during hysterectomy but had never had any problems with prior surgery   BREAST BIOPSY Left 08/09/2015   1:00 7 cmfn coil marker, benign adipose tissue   BREAST BIOPSY Left 08/09/2015   1:00 6 cmfn, wing marker, Invasive mammary carcinoma   [facility]   COLONOSCOPY     MULTIPLE   CYSTOSCOPY N/A 05/07/2018   Procedure: CYSTOSCOPY;  Surgeon: Ward, CHonor Loh MD;  Location: [facility] ORS;  Service: Gynecology;  Laterality: N/A;   EXCISION OF BREAST LESION Left 09/04/2015   Procedure: EXCISION OF SKIN LESION @ AREOLA LEFT BREAST;  Surgeon: Leonie Green, MD;  Location: [facility];  Service: General;  Laterality: Left;   LAPAROSCOPIC BILATERAL SALPINGO OOPHERECTOMY Bilateral 05/07/2018   Procedure: LAPAROSCOPIC BILATERAL  SALPINGO OOPHORECTOMY;  Surgeon: Ward, Honor Loh, MD;  Location: [facility] OR[facility];  Service: Gynecology;  Laterality: Bilateral;   MASTECTOMY W/ SENTINEL NODE BIOPSY Left 09/04/2015   Procedure: MASTECTOMY WITH SENTINEL LYMPH NODE BIOPSY;  Surgeon: Leonie Green, MD;  Location: [facility];  Service: General;  Laterality: Left;   PARTIAL MASTECTOMY WITH NEEDLE LOCALIZATION Left 09/04/2015   Procedure: PARTIAL MASTECTOMY WITH PREOP XRAY NEEDLE LOCALIZATION;  Surgeon: Leonie Green, MD;  Location: [facility];  Service: General;  Laterality: Left;   TUBAL LIGATION      FAMILY HISTORY Family History  Problem Relation Age of Onset   Liver cancer Mother    Colon cancer Maternal Grandmother    Breast cancer Cousin        ADVANCED DIRECTIVES:    HEALTH MAINTENANCE: Social History   Tobacco Use   Smoking status: Former Smoker    Packs/day: 0.50    Years: 35.00    Pack years: 17.50    Types: E-cigarettes   Smokeless tobacco: Former Systems developer    Quit date: 08/19/2012   Tobacco comment: VAPOR CIG   Substance Use Topics   Alcohol use: No   Drug use: No     Colonoscopy:  PAP:  Bone density:  Lipid panel:  Allergies  Allergen Reactions   Other Other (See Comments)    DARVOCET    Current Outpatient Medications  Medication Sig Dispense Refill   acetaminophen (TYLENOL) 500 MG tablet Take 2 tablets (1,000 mg total) by mouth every 6 (six) hours. No more than 4031m of acetaminophen per day 30 tablet 0   anastrozole (ARIMIDEX) 1 MG tablet TAKE 1 TABLET(1 MG) BY MOUTH DAILY 90 tablet 3   beclomethasone (QVAR REDIHALER) 40 MCG/ACT inhaler Inhale 2 puffs into the lungs 2 (two) times daily. (Patient not taking: Reported on 10/05/2018) 1 Inhaler 0   diphenhydrAMINE (BENADRYL) 25 MG tablet Take 25 mg by mouth every 6 (six) hours as needed.     Multiple Vitamins-Minerals (CENTRUM ULTRA WOMENS PO) Take 1 tablet by mouth daily.      PARoxetine (PAXIL) 10 MG tablet Take 1 tablet by mouth  1 day or 1 dose.     No current facility-administered medications for this visit.     OBJECTIVE: There were no vitals filed for this visit.   There is no height or weight on file to calculate BMI.    ECOG FS:0 - Asymptomatic  General: Well-developed, well-nourished, no acute distress. HEENT: Normocephalic, moist mucous membranes. Neuro: Alert, answering all questions appropriately. Cranial nerves grossly intact. Skin: No rashes or petechiae noted. Psych: Normal affect.  LAB RESULTS:  Lab Results  Component Value Date   NA 138 05/08/2018   K 3.1 (L) 05/08/2018   CL 103 05/08/2018   CO2 28 05/08/2018   GLUCOSE 124 (H) 05/08/2018   BUN 6 (L) 05/08/2018   CREATININE 0.83 05/08/2018   CALCIUM 8.1 (L) 05/08/2018   PROT 7.7 05/05/2018   ALBUMIN 4.1 05/05/2018   AST 36 05/05/2018   ALT 15 05/05/2018   ALKPHOS 63 05/05/2018   BILITOT 0.8 05/05/2018   GFRNONAA >60 05/08/2018   GFRAA >60 05/08/2018  Lab Results  Component Value Date   WBC 13.2 (H) 05/08/2018   NEUTROABS 15.3 (H) 05/05/2018   HGB 11.3 (L) 05/08/2018   HCT 35.0 (L) 05/08/2018   MCV 95.6 05/08/2018   PLT 283 05/08/2018     STUDIES: Dg Bone Density  Result Date: 03/23/2019 EXAM: DUAL X-RAY ABSORPTIOMETRY (DXA) FOR BONE MINERAL DENSITY IMPRESSION: Technologist: jbh Your patient Debra Joseph completed a BMD test on 03/23/2019 using the [place] (analysis version: 14.10) manufactured by [employer]. The following summarizes the results of our evaluation. PATIENT BIOGRAPHICAL: Name: Debra Joseph, Debra Joseph Patient ID: [id number] Birth Date: May 26, 1951 Height: 63.0 in. Gender: Female Exam Date: 03/23/2019 Weight: 176.6 lbs. Indications: Breast CA, Caucasian, Height Loss, History of Radiation, Hysterectomy, Oophorectomy Bilateral, Postmenopausal, Tobacco User (Current Smoker) Fractures: Treatments: Arimidex ASSESSMENT: The BMD measured at AP Spine L1-L2 is 0.833 g/cm2 with a T-score of -2.8. This patient is  considered OSTEOPOROTIC according to [facility]) criteria. L3 & L4 was excluded due to degenerative changes. The scan quality is good. Site Region Measured Measured WHO Young Adult BMD Date       Age      Classification T-score AP Spine L1-L2 5/23/2019 65.8 Osteoporosis -2.8 0.833 g/cm2 AP Spine L1-L2 02/24/2018 64.8 Osteopenia -2.0 0.936 g/cm2 AP Spine L1-L2 02/19/2017 63.8 Osteopenia -2.1 0.915 g/cm2 AP Spine L1-L2 01/08/2016 62.6 Osteopenia -2.2 0.907 g/cm2 DualFemur Neck Left 03/23/2019 65.8 Osteopenia -1.2 0.873 g/cm2 DualFemur Neck Left 02/24/2018 64.8 Normal -0.9 0.920 g/cm2 DualFemur Neck Left 02/19/2017 63.8 Normal -0.9 0.914 g/cm2 DualFemur Neck Left 01/08/2016 62.6 Normal -0.9 0.917 g/cm2 DualFemur Total Mean 03/23/2019 65.8 Normal -1.0 0.881 g/cm2 DualFemur Total Mean 02/24/2018 64.8 Normal -0.7 0.915 g/cm2 DualFemur Total Mean 02/19/2017 63.8 Normal -0.7 0.915 g/cm2 DualFemur Total Mean 01/08/2016 62.6 Normal -0.6 0.935 g/cm2 World Health Organization [facility]) criteria for post-menopausal, Caucasian Women: Normal:       T-score at or above -1 SD Osteopenia:   T-score between -1 and -2.5 SD Osteoporosis: T-score at or below -2.5 SD RECOMMENDATIONS: 1. All patients should optimize calcium and vitamin D intake. 2. Consider FDA-approved medical therapies in postmenopausal women and men aged 12 years and older, based on the following: a. A hip or vertebral(clinical or morphometric) fracture b. T-score < -2.5 at the femoral neck or spine after appropriate evaluation to exclude secondary causes c. Low bone mass (T-score between -1.0 and -2.5 at the femoral neck or spine) and a 10-year probability of a hip fracture > 3% or a 10-year probability of a major osteoporosis-related fracture > 20% based on the [place]-adapted WHO algorithm d. Clinician judgment and/or patient preferences may indicate treatment for people with 10-year fracture probabilities above or below these levels FOLLOW-UP: People  with diagnosed cases of osteoporosis or at high risk for fracture should have regular bone mineral density tests. For patients eligible for Medicare, routine testing is allowed once every 2 years. The testing frequency can be increased to one year for patients who have rapidly progressing disease, those who are receiving or discontinuing medical therapy to restore bone mass, or have additional risk factors. I have reviewed this report, and agree with the above findings. Mark A. Thornton Papas, M.D. [facility] Radiology Electronically Signed   By: Lavonia Dana M.D.   On: 03/23/2019 11:45   Mm Diag Breast Tomo Uni Left  Result Date: 03/23/2019 CLINICAL DATA:  68 year old female presenting for six-month follow-up of probably benign left breast calcifications at her lumpectomy site. She had a left  breast lumpectomy in 2017. EXAM: DIGITAL DIAGNOSTIC UNILATERAL LEFT MAMMOGRAM WITH CAD AND TOMO COMPARISON:  Previous exam(s). ACR Breast Density Category c: The breast tissue is heterogeneously dense, which may obscure small masses. FINDINGS: The left breast lumpectomy site is stable. Again seen are tightly grouped calcifications along the superior lateral aspect of the lumpectomy site. These have become more coarse in appearance, and are likely dystrophic. No suspicious calcifications, masses or areas of distortion are seen in the left breast. Mammographic images were processed with CAD. IMPRESSION: The likely benign tiny group of calcifications at the left breast lumpectomy site are likely dystrophic. RECOMMENDATION: Bilateral diagnostic mammogram is recommended in April of 2021. I have discussed the findings and recommendations with the patient. If applicable, a reminder letter will be sent to the patient regarding the next appointment. BI-RADS CATEGORY  3: Probably benign. Electronically Signed   By: Ammie Ferrier M.D.   On: 03/23/2019 11:37    ASSESSMENT: Pathologic stage Ia ER/PR positive, HER-2 negative  adenocarcinoma of the upper outer quadrant of left breast. Oncotype DX score 13 which is low risk.  PLAN:    1. Pathologic stage Ia ER/PR positive, HER-2 negative adenocarcinoma of the upper outer quadrant of left breast:  Patient had a low risk Oncotype score therefore adjuvant chemotherapy was not necessary. She completed adjuvant XRT. Patient could not tolerate letrozole, therefore she was switched to anastrozole and is tolerating this well.  Continue anastrozole for a total of 5 years completing in July 2022.  Her most recent mammogram on September 20, 2018 was reported as BI-RADS 3 and recommendation was to repeat unilateral left breast diagnostic mammogram in 6 months.  Patient will return to clinic 1 to 2 days after this mammogram to discuss the results.   2.  Osteopenia: Bone mineral density completed on February 24, 2018 reported T score of -2.0 which is slightly worse than 1 year prior when the reported T score was -1.7.  Continue calcium and vitamin D supplementation.  Consider Fosamax in the future if her bone mineral density continues to decline.  Will repeat bone mineral density along with mammogram in 6 months as above.   I provided 20 minutes of face-to-face video visit time during this encounter, and > 50% was spent counseling as documented under my assessment & plan.   Patient expressed understanding and was in agreement with this plan. She also understands that She can call clinic at any time with any questions, concerns, or complaints.    Breast cancer, left [facility])   Staging form: Breast, AJCC 7th Edition     Clinical stage from 09/02/2015: Stage IA (T1c, N0, M0) - Signed by Lloyd Huger, MD on 09/02/2015   Lloyd Huger, MD   03/27/2019 7:10 AM

## 2019-03-29 ENCOUNTER — Inpatient Hospital Stay: Payer: Medicare Other | Admitting: Oncology

## 2019-04-01 IMAGING — CR DG CHEST 2V
2 series · 3 of 3 positions shown · non-contrast
Comparison: No prior chest x-ray. Thoracic spine x-rays 06/19/2017
are correlated.

CLINICAL DATA: Preoperative respiratory evaluation prior to
hysterectomy and BILATERAL oophorectomy due to a mass in the RIGHT
adnexum. Personal history of LEFT mastectomy for breast cancer.

EXAM:
CHEST - 2 VIEW

[Series 2: chest lat · 0.14mm/px · 2 of 2 slices shown]
[im 1/2]
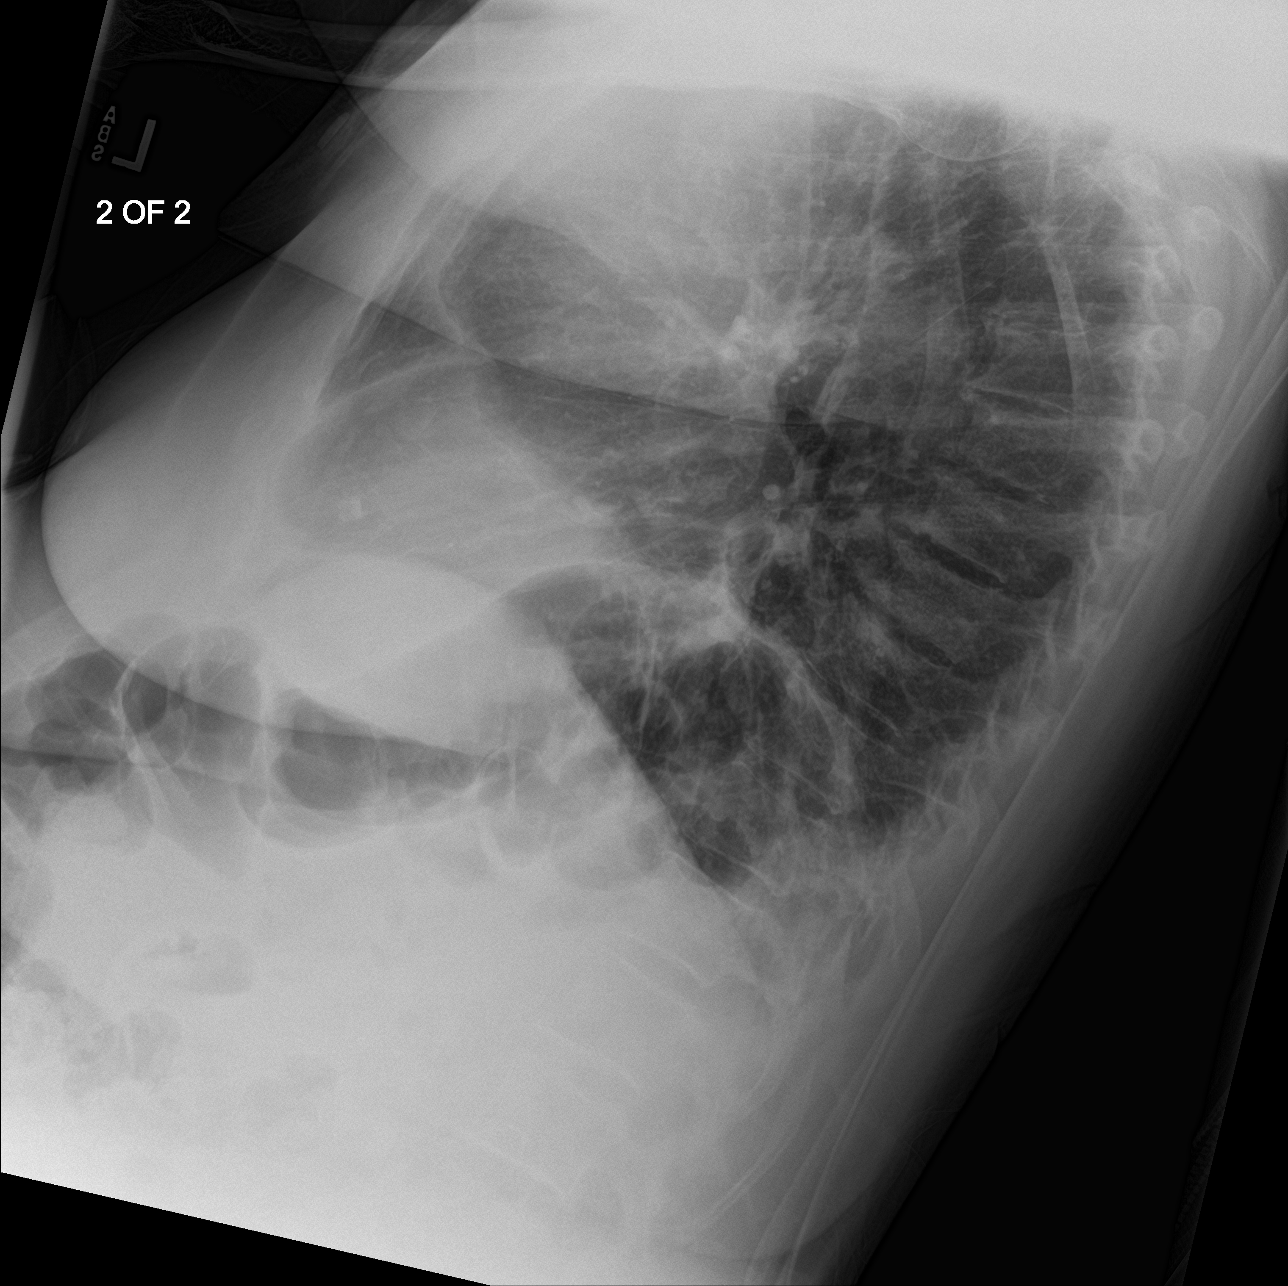
[im 2/2]
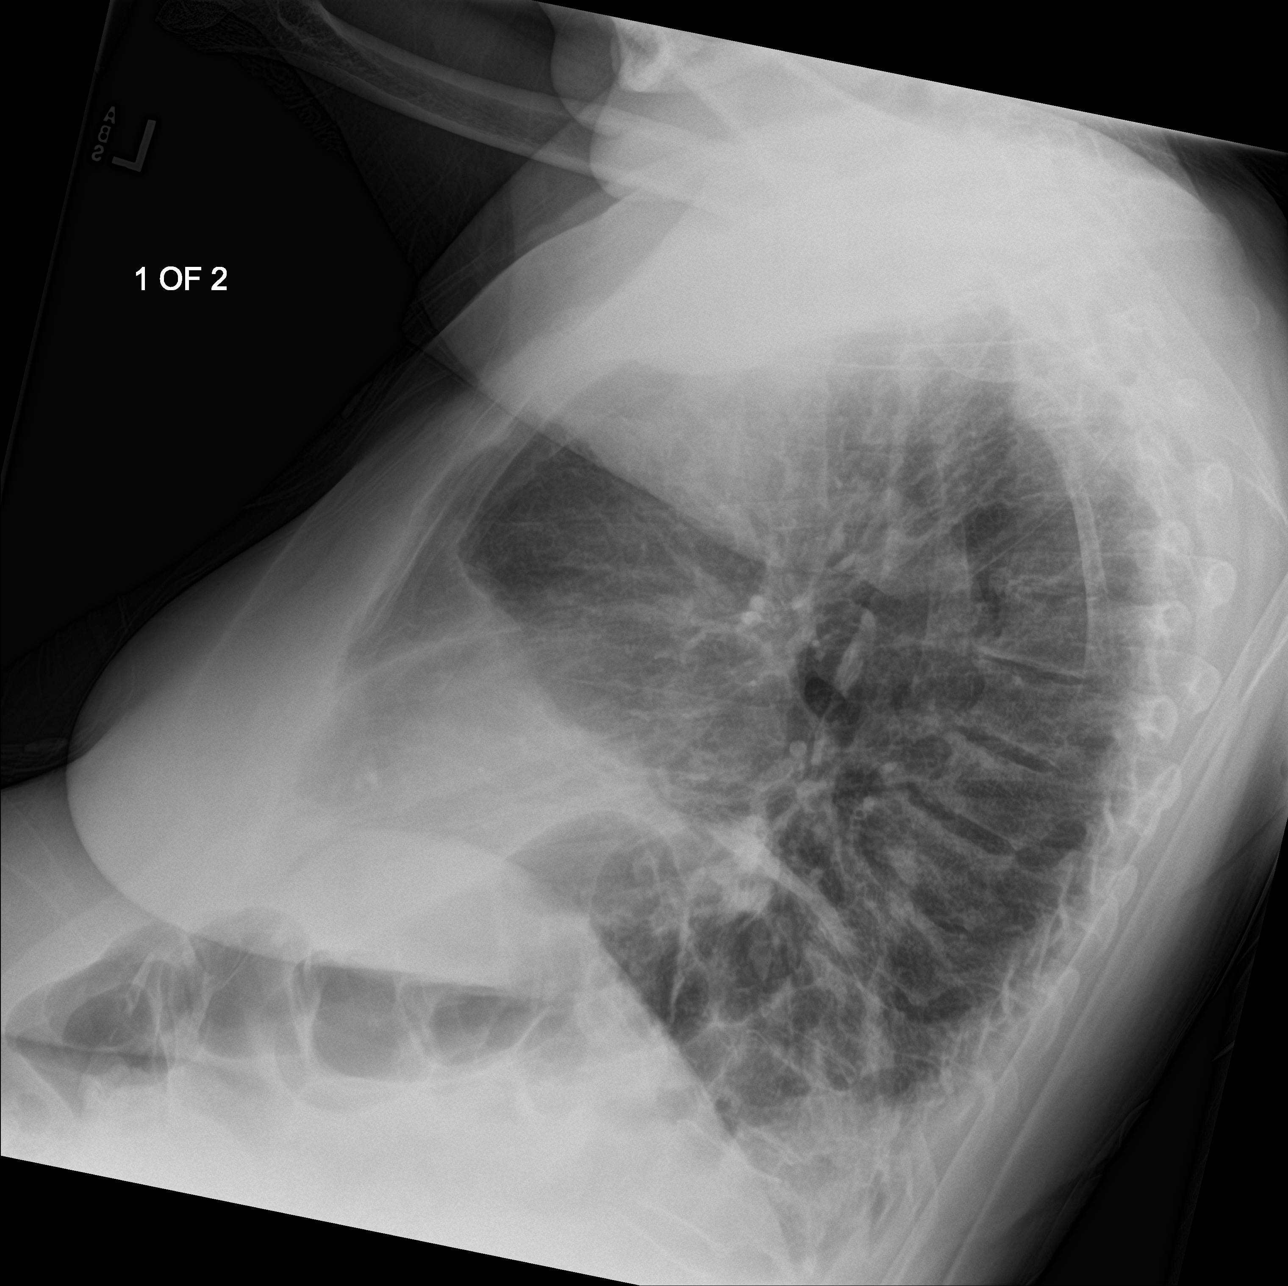

[chest ap]
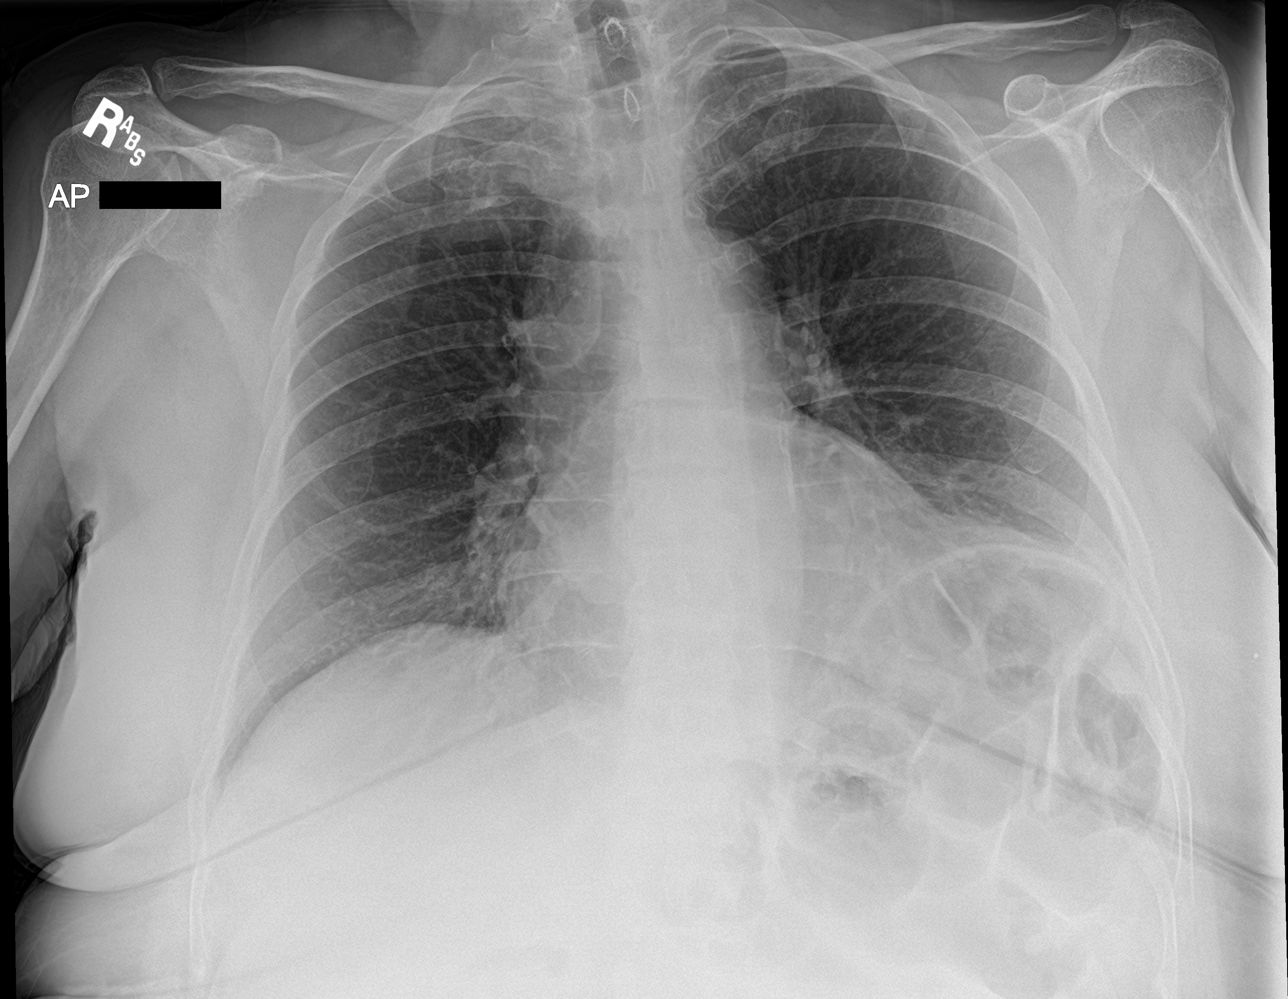

[3 of 3 positions shown; findings below may reference images not displayed]

FINDINGS: AP ERECT and LATERAL images were obtained. Suboptimal inspiration.
Chronic elevation of the LEFT hemidiaphragm (visible on the prior
thoracic spine x-rays) with chronic scar/atelectasis involving the
LEFT LOWER LOBE and lingula. Lungs otherwise clear. No pulmonary
parenchymal nodules. No pleural effusions. Degenerative changes
involving the thoracic spine.
IMPRESSION: Suboptimal inspiration. Chronic elevation of the LEFT hemidiaphragm
with chronic scar/atelectasis involving the LEFT LOWER LOBE and
lingula. No acute cardiopulmonary disease otherwise.

## 2019-08-26 ENCOUNTER — Other Ambulatory Visit: Payer: Self-pay | Admitting: Oncology

## 2019-11-24 ENCOUNTER — Other Ambulatory Visit: Payer: Self-pay | Admitting: Oncology

## 2019-12-12 ENCOUNTER — Other Ambulatory Visit: Payer: Self-pay | Admitting: Oncology

## 2019-12-12 ENCOUNTER — Telehealth: Payer: Self-pay | Admitting: *Deleted

## 2019-12-12 MED ORDER — ANASTROZOLE 1 MG PO TABS: 1.0000 mg | ORAL_TABLET | Freq: Every day | ORAL | 0 refills | Status: DC

## 2019-12-12 NOTE — Telephone Encounter (Signed)
Patient called asking for a refill of her anastrozole for #90 with 3 refills to be sent to [facility] in [place] as she has leased a house there and is unsure if she is going to sell her house in [place] or move there. She does not want to get a new oncologist until she decides. I told her that we can only fill a 30 day supply due to her missing her appointment in October and not having a follow up scheduled and has not been seen since May 2020 and that she must make an appointment and be seen before we can refill it again. I transferred her to scheduling to make an appointment. She prefers a virtual appointment at this time.Marland Kitchen

## 2019-12-14 NOTE — Telephone Encounter (Signed)
Not sure if this was taken care of, so thank you!

## 2019-12-19 ENCOUNTER — Telehealth: Payer: Self-pay | Admitting: Oncology

## 2019-12-19 ENCOUNTER — Other Ambulatory Visit: Payer: Self-pay

## 2019-12-19 DIAGNOSIS — Z170 Estrogen receptor positive status [ER+]: Secondary | ICD-10-CM

## 2019-12-19 NOTE — Telephone Encounter (Signed)
12/19/2019 Pt called today asking to r/s her appt that she missed back in 03/2019. Per Dr. Woodfin Ganja she also needed to get her annual mammogram done. Called pt and left a voicemail informing her of the upcoming mammogram scheduled for 8/12 @ 10 am and the f/u with Finnegan on 8/13 @ 3:30. Left call back number & encouraged her to call if there were any scheduling issues SRW

## 2020-01-09 ENCOUNTER — Telehealth: Payer: Self-pay | Admitting: Oncology

## 2020-01-09 NOTE — Telephone Encounter (Signed)
Called pt to move 8/9 appt to 1:00 per Woodfin Ganja. Has a meeting. No answer from pt and VM is full. Will attempt later.

## 2020-01-12 ENCOUNTER — Other Ambulatory Visit: Payer: Self-pay | Admitting: Oncology

## 2020-01-12 ENCOUNTER — Other Ambulatory Visit: Payer: Self-pay

## 2020-01-12 ENCOUNTER — Ambulatory Visit
Admission: RE | Admit: 2020-01-12 | Discharge: 2020-01-12 | Disposition: A | Discharge status: Home / Self Care | Payer: Medicare PPO | Source: Ambulatory Visit | Attending: Oncology | Admitting: Oncology

## 2020-01-12 DIAGNOSIS — C50412 Malignant neoplasm of upper-outer quadrant of left female breast: Secondary | ICD-10-CM | POA: Diagnosis present

## 2020-01-12 DIAGNOSIS — Z170 Estrogen receptor positive status [ER+]: Secondary | ICD-10-CM

## 2020-01-13 ENCOUNTER — Inpatient Hospital Stay: Payer: Medicare PPO | Attending: Oncology | Admitting: Oncology

## 2020-01-13 ENCOUNTER — Encounter: Payer: Self-pay | Admitting: Oncology

## 2020-01-13 VITALS — BP 135/72 | HR 98 | Temp 97.5°F | Resp 20 | Wt 181.7 lb

## 2020-01-13 DIAGNOSIS — Z803 Family history of malignant neoplasm of breast: Secondary | ICD-10-CM | POA: Insufficient documentation

## 2020-01-13 DIAGNOSIS — Z79811 Long term (current) use of aromatase inhibitors: Secondary | ICD-10-CM | POA: Diagnosis not present

## 2020-01-13 DIAGNOSIS — Z170 Estrogen receptor positive status [ER+]: Secondary | ICD-10-CM | POA: Diagnosis not present

## 2020-01-13 DIAGNOSIS — C50412 Malignant neoplasm of upper-outer quadrant of left female breast: Secondary | ICD-10-CM | POA: Diagnosis not present

## 2020-01-13 DIAGNOSIS — Z87891 Personal history of nicotine dependence: Secondary | ICD-10-CM | POA: Diagnosis not present

## 2020-01-13 DIAGNOSIS — M810 Age-related osteoporosis without current pathological fracture: Secondary | ICD-10-CM | POA: Insufficient documentation

## 2020-01-13 DIAGNOSIS — M129 Arthropathy, unspecified: Secondary | ICD-10-CM | POA: Diagnosis not present

## 2020-01-13 DIAGNOSIS — Z923 Personal history of irradiation: Secondary | ICD-10-CM | POA: Insufficient documentation

## 2020-01-13 DIAGNOSIS — R011 Cardiac murmur, unspecified: Secondary | ICD-10-CM | POA: Insufficient documentation

## 2020-01-13 DIAGNOSIS — Z800 Family history of malignant neoplasm of digestive organs: Secondary | ICD-10-CM | POA: Diagnosis not present

## 2020-01-13 DIAGNOSIS — Z79899 Other long term (current) drug therapy: Secondary | ICD-10-CM | POA: Diagnosis not present

## 2020-01-14 NOTE — Progress Notes (Signed)
[place]  [phone] Fax:(336) [phone]  ID: ATINA FEELEY OB: 10/29/1950  MR#: [id number]  TLX#:[id number]  Patient Care Team: Baxter Hire, MD as PCP - General (Internal Medicine)   CHIEF COMPLAINT: Pathologic stage Ia ER/PR positive, HER-2 negative adenocarcinoma of the upper outer quadrant of left breast. Oncotype DX score 13 which is low risk.  INTERVAL HISTORY: Patient returns to clinic today for routine 44-monthevaluation.  She continues to feel well and remains asymptomatic.  She is tolerating anastrozole without significant side effects. She has no neurologic complaints. She denies any recent fevers or illnesses. She denies any pain.  She denies any chest pain, shortness of breath, cough, or hemoptysis.  She denies any nausea, vomiting, constipation, or diarrhea. She has no urinary complaints.  Patient offers no specific complaints today.  REVIEW OF SYSTEMS:   Review of Systems  Constitutional: Negative.  Negative for fever, malaise/fatigue and weight loss.  Respiratory: Negative.  Negative for cough and sputum production.   Cardiovascular: Negative.  Negative for chest pain and leg swelling.  Gastrointestinal: Negative.  Negative for abdominal pain.  Genitourinary: Negative.  Negative for dysuria.  Musculoskeletal: Negative.  Negative for back pain.  Skin: Negative.  Negative for rash.  Neurological: Negative.  Negative for sensory change, focal weakness, weakness and headaches.  Psychiatric/Behavioral: Negative.  The patient is not nervous/anxious.     As per HPI. Otherwise, a complete review of systems is negative.  PAST MEDICAL HISTORY: Past Medical History:  Diagnosis Date  . Anemia   . Arthritis   . Breast cancer ([facility] 2017   Left breast cancer Invasive mammary carcinoma  . Cancer ([facility]   COLON CA  . [place] [place]   cancerous polyp removed  . Dysrhythmia    PALPITATIONS  . Endometriosis   . Family history  of adverse reaction to anesthesia    MOM-NAUSEATED  . Fibroids   . Heart murmur   . Hepatitis B    AGE 69  . Hepatitis C 1999   NO CIRRHOSIS  . Personal history of radiation therapy 2017   Left breast  . Seasonal allergies     PAST SURGICAL HISTORY: Past Surgical History:  Procedure Laterality Date  . ABDOMINAL HYSTERECTOMY     pt states hemorrhage during hysterectomy but had never had any problems with prior surgery  . BREAST BIOPSY Left 08/09/2015   1:00 7 cmfn coil marker, benign adipose tissue  . BREAST BIOPSY Left 08/09/2015   1:00 6 cmfn, wing marker, Invasive mammary carcinoma  . [facility]   [facility]  . COLONOSCOPY     MULTIPLE  . CYSTOSCOPY N/A 05/07/2018   Procedure: CYSTOSCOPY;  Surgeon: Ward, CHonor Loh MD;  Location: [facility] ORS;  Service: Gynecology;  Laterality: N/A;  . EXCISION OF BREAST LESION Left 09/04/2015   Procedure: EXCISION OF SKIN LESION @ AREOLA LEFT BREAST;  Surgeon: JLeonie Green MD;  Location: [facility] ORS;  Service: General;  Laterality: Left;  . LAPAROSCOPIC BILATERAL SALPINGO OOPHERECTOMY Bilateral 05/07/2018   Procedure: LAPAROSCOPIC BILATERAL SALPINGO OOPHORECTOMY;  Surgeon: Ward, CHonor Loh MD;  Location: [facility] ORS;  Service: Gynecology;  Laterality: Bilateral;  . MASTECTOMY W/ SENTINEL NODE BIOPSY Left 09/04/2015   Procedure: MASTECTOMY WITH SENTINEL LYMPH NODE BIOPSY;  Surgeon: JLeonie Green MD;  Location: [facility] ORS;  Service: General;  Laterality: Left;  . PARTIAL MASTECTOMY WITH NEEDLE LOCALIZATION Left 09/04/2015   Procedure:  PARTIAL MASTECTOMY WITH PREOP XRAY NEEDLE LOCALIZATION;  Surgeon: Leonie Green, MD;  Location: [facility] ORS;  Service: General;  Laterality: Left;  . TUBAL LIGATION      FAMILY HISTORY Family History  Problem Relation Age of Onset  . Liver cancer Mother   . Colon cancer Maternal Grandmother   . Breast cancer Cousin        ADVANCED DIRECTIVES:     HEALTH MAINTENANCE: Social History   Tobacco Use  . Smoking status: Former Smoker    Packs/day: 0.50    Years: 35.00    Pack years: 17.50    Types: E-cigarettes  . Smokeless tobacco: Former Systems developer    Quit date: 08/19/2012  . Tobacco comment: VAPOR CIG   Vaping Use  . Vaping Use: Some days  . Substances: Nicotine  Substance Use Topics  . Alcohol use: No  . Drug use: No     Colonoscopy:  PAP:  Bone density:  Lipid panel:  Allergies  Allergen Reactions  . Other Other (See Comments)    DARVOCET    Current Outpatient Medications  Medication Sig Dispense Refill  . acetaminophen (TYLENOL) 500 MG tablet Take 2 tablets (1,000 mg total) by mouth every 6 (six) hours. No more than 4068m of acetaminophen per day 30 tablet 0  . anastrozole (ARIMIDEX) 1 MG tablet Take 1 tablet (1 mg total) by mouth daily. 30 tablet 0  . diphenhydrAMINE (BENADRYL) 25 MG tablet Take 25 mg by mouth every 6 (six) hours as needed.    . Multiple Vitamins-Minerals (CENTRUM ULTRA WOMENS PO) Take 1 tablet by mouth daily.     . beclomethasone (QVAR REDIHALER) 40 MCG/ACT inhaler Inhale 2 puffs into the lungs 2 (two) times daily. (Patient not taking: Reported on 10/05/2018) 1 Inhaler 0  . PARoxetine (PAXIL) 10 MG tablet Take 1 tablet by mouth 1 day or 1 dose.     No current facility-administered medications for this visit.    OBJECTIVE: Vitals:   01/13/20 1309  BP: 135/72  Pulse: 98  Resp: 20  Temp: (!) 97.5 F (36.4 C)  SpO2: 100%     Body mass index is 32.19 kg/m.    ECOG FS:0 - Asymptomatic  General: Well-developed, well-nourished, no acute distress. Eyes: Pink conjunctiva, anicteric sclera. HEENT: Normocephalic, moist mucous membranes. Breast: Exam deferred today. Lungs: No audible wheezing or coughing. Heart: Regular rate and rhythm. Abdomen: Soft, nontender, no obvious distention. Musculoskeletal: No edema, cyanosis, or clubbing. Neuro: Alert, answering all questions appropriately. Cranial  nerves grossly intact. Skin: No rashes or petechiae noted. Psych: Normal affect.  LAB RESULTS:  Lab Results  Component Value Date   NA 138 05/08/2018   K 3.1 (L) 05/08/2018   CL 103 05/08/2018   CO2 28 05/08/2018   GLUCOSE 124 (H) 05/08/2018   BUN 6 (L) 05/08/2018   CREATININE 0.83 05/08/2018   CALCIUM 8.1 (L) 05/08/2018   PROT 7.7 05/05/2018   ALBUMIN 4.1 05/05/2018   AST 36 05/05/2018   ALT 15 05/05/2018   ALKPHOS 63 05/05/2018   BILITOT 0.8 05/05/2018   GFRNONAA >60 05/08/2018   GFRAA >60 05/08/2018    Lab Results  Component Value Date   WBC 13.2 (H) 05/08/2018   NEUTROABS 15.3 (H) 05/05/2018   HGB 11.3 (L) 05/08/2018   HCT 35.0 (L) 05/08/2018   MCV 95.6 05/08/2018   PLT 283 05/08/2018     STUDIES: MM DIAG BREAST TOMO BILATERAL  Result Date: 01/12/2020 CLINICAL DATA:  LEFT lumpectomy 2017.  Calcifications at the lumpectomy bed were placed under BIRADS 3 observation in April of 2020. EXAM: DIGITAL DIAGNOSTIC BILATERAL MAMMOGRAM WITH TOMO AND CAD COMPARISON:  Previous exam(s). ACR Breast Density Category c: The breast tissue is heterogeneously dense, which may obscure small masses. FINDINGS: There is density and architectural distortion within the LEFT breast, consistent with postsurgical changes. These are stable in comparison to prior. Spot magnification views confirm that calcifications at the lumpectomy bed have progressively become coarser and more dystrophic. They are now benign appearance. No mammographic evidence of malignancy in the contralateral breast. Mammographic images were processed with CAD. IMPRESSION: Benign dystrophic calcifications in the LEFT breast lumpectomy bed. No mammographic evidence of malignancy. RECOMMENDATION: Diagnostic mammogram is suggested in 1 year. (Code:DM-B-01Y) I have discussed the findings and recommendations with the patient. If applicable, a reminder letter will be sent to the patient regarding the next appointment. BI-RADS CATEGORY   2: Benign. Electronically Signed   By: Valentino Saxon MD   On: 01/12/2020 10:55    ASSESSMENT: Pathologic stage Ia ER/PR positive, HER-2 negative adenocarcinoma of the upper outer quadrant of left breast. Oncotype DX score 13 which is low risk.  PLAN:    1. Pathologic stage Ia ER/PR positive, HER-2 negative adenocarcinoma of the upper outer quadrant of left breast:  Patient had a low risk Oncotype score therefore adjuvant chemotherapy was not necessary. She completed adjuvant XRT. Patient could not tolerate letrozole, therefore she was switched to anastrozole and is tolerating this well.  Patient will complete 5 years of anastrozole in July 2022.  Patient's most recent mammogram on January 12, 2020 was reported as BI-RADS 2.  Repeat in August 2022.  Patient will have video assisted telemedicine visit in 6 months. 2.  Osteoporosis: Bone mineral density completed on March 23, 2019 revealed a T score of -2.8 which was significantly worse than 1 year prior where her T score was reported -2.0.  Patient was initiated on Fosamax at that time.  Continue dietary calcium and vitamin D as well.  No further intervention is needed.  Repeat bone mineral density in October 2021.    Patient expressed understanding and was in agreement with this plan. She also understands that She can call clinic at any time with any questions, concerns, or complaints.    Breast cancer, left [facility])   Staging form: Breast, AJCC 7th Edition     Clinical stage from 09/02/2015: Stage IA (T1c, N0, M0) - Signed by Lloyd Huger, MD on 09/02/2015   Lloyd Huger, MD   01/14/2020 2:14 PM

## 2020-01-16 ENCOUNTER — Other Ambulatory Visit: Payer: Self-pay | Admitting: *Deleted

## 2020-01-16 MED ORDER — ANASTROZOLE 1 MG PO TABS: 1.0000 mg | ORAL_TABLET | Freq: Every day | ORAL | 0 refills | Status: DC

## 2020-01-16 MED ORDER — ALENDRONATE SODIUM 70 MG PO TABS: 70.0000 mg | ORAL_TABLET | ORAL | 0 refills | Status: DC

## 2020-04-04 ENCOUNTER — Other Ambulatory Visit: Payer: Self-pay | Admitting: Oncology

## 2020-04-10 ENCOUNTER — Other Ambulatory Visit: Payer: Self-pay | Admitting: Oncology

## 2020-07-14 NOTE — Progress Notes (Deleted)
[place]  Telephone:(336) [phone] Fax:(336) [phone]  ID: Debra Joseph OB: [birth year]  MR#: [id number]  YDX#:[id number]  Patient Care Team: Baxter Hire, MD as PCP - General (Internal Medicine)  I connected with Debra Joseph on 07/14/20 at  2:00 PM EST by {Blank single:19197::"video enabled telemedicine visit","telephone visit"} and verified that I am speaking with the correct person using two identifiers.   I discussed the limitations, risks, security and privacy concerns of performing an evaluation and management service by telemedicine and the availability of in-person appointments. I also discussed with the patient that there may be a patient responsible charge related to this service. The patient expressed understanding and agreed to proceed.   Other persons participating in the visit and their role in the encounter: Patient, MD.  Patient's location: Home. Provider's location: Clinic.  CHIEF COMPLAINT: Pathologic stage Ia ER/PR positive, HER-2 negative adenocarcinoma of the upper outer quadrant of left breast. Oncotype DX score 13 which is low risk.  INTERVAL HISTORY: Patient returns to clinic today for routine 64-monthevaluation.  She continues to feel well and remains asymptomatic.  She is tolerating anastrozole without significant side effects. She has no neurologic complaints. She denies any recent fevers or illnesses. She denies any pain.  She denies any chest pain, shortness of breath, cough, or hemoptysis.  She denies any nausea, vomiting, constipation, or diarrhea. She has no urinary complaints.  Patient offers no specific complaints today.  REVIEW OF SYSTEMS:   Review of Systems  Constitutional: Negative.  Negative for fever, malaise/fatigue and weight loss.  Respiratory: Negative.  Negative for cough and sputum production.   Cardiovascular: Negative.  Negative for chest pain and leg swelling.  Gastrointestinal: Negative.  Negative for abdominal  pain.  Genitourinary: Negative.  Negative for dysuria.  Musculoskeletal: Negative.  Negative for back pain.  Skin: Negative.  Negative for rash.  Neurological: Negative.  Negative for sensory change, focal weakness, weakness and headaches.  Psychiatric/Behavioral: Negative.  The patient is not nervous/anxious.     As per HPI. Otherwise, a complete review of systems is negative.  PAST MEDICAL HISTORY: Past Medical History:  Diagnosis Date  . Anemia   . Arthritis   . Breast cancer [place]   Left breast cancer Invasive mammary carcinoma  . [place] [place]   COLON CA  . [place]   cancerous polyp removed  . Dysrhythmia    PALPITATIONS  . Endometriosis   . Family history of adverse reaction to anesthesia    MOM-NAUSEATED  . Fibroids   . Heart murmur   . Hepatitis B    AGE 21  . Hepatitis C 1999   NO CIRRHOSIS  . Personal history of radiation therapy 2017   Left breast  . Seasonal allergies     PAST SURGICAL HISTORY: Past Surgical History:  Procedure Laterality Date  . ABDOMINAL HYSTERECTOMY     pt states hemorrhage during hysterectomy but had never had any problems with prior surgery  . BREAST BIOPSY Left 08/09/2015   1:00 7 cmfn coil marker, benign adipose tissue  . BREAST BIOPSY Left 08/09/2015   1:00 6 cmfn, wing marker, Invasive mammary carcinoma  . [facility]04/2015   [facility]  . COLONOSCOPY     MULTIPLE  . CYSTOSCOPY N/A 05/07/2018   Procedure: CYSTOSCOPY;  Surgeon: Ward, CHonor Loh MD;  Location: [facility] ORS;  Service: Gynecology;  Laterality: N/A;  . EXCISION OF  BREAST LESION Left 09/04/2015   Procedure: EXCISION OF SKIN LESION @ AREOLA LEFT BREAST;  Surgeon: Leonie Green, MD;  Location: [facility] OR[facility];  Service: General;  Laterality: Left;  . LAPAROSCOPIC BILATERAL SALPINGO OOPHERECTOMY Bilateral 05/07/2018   Procedure: LAPAROSCOPIC BILATERAL SALPINGO OOPHORECTOMY;  Surgeon: Ward, Honor Loh, MD;   Location: [facility] ORS;  Service: Gynecology;  Laterality: Bilateral;  . MASTECTOMY W/ SENTINEL NODE BIOPSY Left 09/04/2015   Procedure: MASTECTOMY WITH SENTINEL LYMPH NODE BIOPSY;  Surgeon: Leonie Green, MD;  Location: [facility] ORS;  Service: General;  Laterality: Left;  . PARTIAL MASTECTOMY WITH NEEDLE LOCALIZATION Left 09/04/2015   Procedure: PARTIAL MASTECTOMY WITH PREOP XRAY NEEDLE LOCALIZATION;  Surgeon: Leonie Green, MD;  Location: [facility] ORS;  Service: General;  Laterality: Left;  . TUBAL LIGATION      FAMILY HISTORY Family History  Problem Relation Age of Onset  . Liver cancer Mother   . Colon cancer Maternal Grandmother   . Breast cancer Cousin        ADVANCED DIRECTIVES:    HEALTH MAINTENANCE: Social History   Tobacco Use  . Smoking status: Former Smoker    Packs/day: 0.50    Years: 35.00    Pack years: 17.50    Types: E-cigarettes  . Smokeless tobacco: Former Systems developer    Quit date: 08/19/2012  . Tobacco comment: VAPOR CIG   Vaping Use  . Vaping Use: Some days  . Substances: Nicotine  Substance Use Topics  . Alcohol use: No  . Drug use: No     Colonoscopy:  PAP:  Bone density:  Lipid panel:  Allergies  Allergen Reactions  . Other Other (See Comments)    DARVOCET    Current Outpatient Medications  Medication Sig Dispense Refill  . acetaminophen (TYLENOL) 500 MG tablet Take 2 tablets (1,000 mg total) by mouth every 6 (six) hours. No more than 4033m of acetaminophen per day 30 tablet 0  . alendronate (FOSAMAX) 70 MG tablet TAKE 1 TABLET(70 MG) BY MOUTH 1 TIME A WEEK WITH A FULL GLASS OF WATER AND ON AN EMPTY STOMACH 12 tablet 0  . anastrozole (ARIMIDEX) 1 MG tablet TAKE 1 TABLET(1 MG) BY MOUTH DAILY 90 tablet 2  . beclomethasone (QVAR REDIHALER) 40 MCG/ACT inhaler Inhale 2 puffs into the lungs 2 (two) times daily. (Patient not taking: Reported on 10/05/2018) 1 Inhaler 0  . diphenhydrAMINE (BENADRYL) 25 MG tablet Take 25 mg by mouth every 6 (six) hours as  needed.    . Multiple Vitamins-Minerals (CENTRUM ULTRA WOMENS PO) Take 1 tablet by mouth daily.     .Marland KitchenPARoxetine (PAXIL) 10 MG tablet Take 1 tablet by mouth 1 day or 1 dose.     No current facility-administered medications for this visit.    OBJECTIVE: There were no vitals filed for this visit.   There is no height or weight on file to calculate BMI.    ECOG FS:0 - Asymptomatic  General: Well-developed, well-nourished, no acute distress. Eyes: Pink conjunctiva, anicteric sclera. HEENT: Normocephalic, moist mucous membranes. Breast: Exam deferred today. Lungs: No audible wheezing or coughing. Heart: Regular rate and rhythm. Abdomen: Soft, nontender, no obvious distention. Musculoskeletal: No edema, cyanosis, or clubbing. Neuro: Alert, answering all questions appropriately. Cranial nerves grossly intact. Skin: No rashes or petechiae noted. Psych: Normal affect.  LAB RESULTS:  Lab Results  Component Value Date   NA 138 05/08/2018   K 3.1 (L) 05/08/2018   CL 103 05/08/2018   CO2 28 05/08/2018  GLUCOSE 124 (H) 05/08/2018   BUN 6 (L) 05/08/2018   CREATININE 0.83 05/08/2018   CALCIUM 8.1 (L) 05/08/2018   PROT 7.7 05/05/2018   ALBUMIN 4.1 05/05/2018   AST 36 05/05/2018   ALT 15 05/05/2018   ALKPHOS 63 05/05/2018   BILITOT 0.8 05/05/2018   GFRNONAA >60 05/08/2018   GFRAA >60 05/08/2018    Lab Results  Component Value Date   WBC 13.2 (H) 05/08/2018   NEUTROABS 15.3 (H) 05/05/2018   HGB 11.3 (L) 05/08/2018   HCT 35.0 (L) 05/08/2018   MCV 95.6 05/08/2018   PLT 283 05/08/2018     STUDIES: No results found.  ASSESSMENT: Pathologic stage Ia ER/PR positive, HER-2 negative adenocarcinoma of the upper outer quadrant of left breast. Oncotype DX score 13 which is low risk.  PLAN:    1. Pathologic stage Ia ER/PR positive, HER-2 negative adenocarcinoma of the upper outer quadrant of left breast:  Patient had a low risk Oncotype score therefore adjuvant chemotherapy was not  necessary. She completed adjuvant XRT. Patient could not tolerate letrozole, therefore she was switched to anastrozole and is tolerating this well.  Patient will complete 5 years of anastrozole in July 2022.  Patient's most recent mammogram on January 12, 2020 was reported as BI-RADS 2.  Repeat in August 2022.  Patient will have video assisted telemedicine visit in 6 months. 2.  Osteoporosis: Bone mineral density completed on March 23, 2019 revealed a T score of -2.8 which was significantly worse than 1 year prior where her T score was reported -2.0.  Patient was initiated on Fosamax at that time.  Continue dietary calcium and vitamin D as well.  No further intervention is needed.  Repeat bone mineral density in October 2021.   I provided *** minutes of {Blank single:19197::"face-to-face video visit time","non face-to-face telephone visit time"} during this encounter which included chart review, counseling, and coordination of care as documented above.  Patient expressed understanding and was in agreement with this plan. She also understands that She can call clinic at any time with any questions, concerns, or complaints.    Breast cancer, left [facility])   Staging form: Breast, AJCC 7th Edition     Clinical stage from 09/02/2015: Stage IA (T1c, N0, M0) - Signed by Lloyd Huger, MD on 09/02/2015   Lloyd Huger, MD   07/14/2020 8:54 AM

## 2020-07-17 ENCOUNTER — Telehealth: Payer: Self-pay | Admitting: *Deleted

## 2020-07-17 ENCOUNTER — Inpatient Hospital Stay: Payer: Medicare PPO | Admitting: Oncology

## 2020-07-17 DIAGNOSIS — C50412 Malignant neoplasm of upper-outer quadrant of left female breast: Secondary | ICD-10-CM

## 2020-07-17 NOTE — Telephone Encounter (Signed)
Entered in error

## 2020-10-15 ENCOUNTER — Other Ambulatory Visit: Payer: Self-pay | Admitting: Oncology

## 2022-07-22 DIAGNOSIS — Z1321 Encounter for screening for nutritional disorder: Secondary | ICD-10-CM | POA: Diagnosis not present

## 2022-07-22 DIAGNOSIS — Z0000 Encounter for general adult medical examination without abnormal findings: Secondary | ICD-10-CM | POA: Diagnosis not present

## 2022-07-22 DIAGNOSIS — Z1231 Encounter for screening mammogram for malignant neoplasm of breast: Secondary | ICD-10-CM | POA: Diagnosis not present

## 2022-07-22 DIAGNOSIS — Z853 Personal history of malignant neoplasm of breast: Secondary | ICD-10-CM | POA: Diagnosis not present

## 2022-07-22 DIAGNOSIS — Z136 Encounter for screening for cardiovascular disorders: Secondary | ICD-10-CM | POA: Diagnosis not present

## 2022-07-22 DIAGNOSIS — F17290 Nicotine dependence, other tobacco product, uncomplicated: Secondary | ICD-10-CM | POA: Diagnosis not present

## 2022-08-07 DIAGNOSIS — Z136 Encounter for screening for cardiovascular disorders: Secondary | ICD-10-CM | POA: Diagnosis not present

## 2022-08-07 DIAGNOSIS — Z1321 Encounter for screening for nutritional disorder: Secondary | ICD-10-CM | POA: Diagnosis not present

## 2022-08-07 DIAGNOSIS — K769 Liver disease, unspecified: Secondary | ICD-10-CM | POA: Diagnosis not present

## 2022-08-12 DIAGNOSIS — F17290 Nicotine dependence, other tobacco product, uncomplicated: Secondary | ICD-10-CM | POA: Diagnosis not present

## 2022-08-12 DIAGNOSIS — Z1321 Encounter for screening for nutritional disorder: Secondary | ICD-10-CM | POA: Diagnosis not present

## 2022-08-12 DIAGNOSIS — E559 Vitamin D deficiency, unspecified: Secondary | ICD-10-CM | POA: Diagnosis not present

## 2022-08-12 DIAGNOSIS — Z1329 Encounter for screening for other suspected endocrine disorder: Secondary | ICD-10-CM | POA: Diagnosis not present

## 2022-09-23 DIAGNOSIS — Z1231 Encounter for screening mammogram for malignant neoplasm of breast: Secondary | ICD-10-CM | POA: Diagnosis not present

## 2022-10-01 DIAGNOSIS — Z0000 Encounter for general adult medical examination without abnormal findings: Secondary | ICD-10-CM | POA: Diagnosis not present

## 2022-10-06 DIAGNOSIS — F17210 Nicotine dependence, cigarettes, uncomplicated: Secondary | ICD-10-CM | POA: Diagnosis not present

## 2022-10-06 DIAGNOSIS — Z122 Encounter for screening for malignant neoplasm of respiratory organs: Secondary | ICD-10-CM | POA: Diagnosis not present

## 2022-10-07 DIAGNOSIS — E559 Vitamin D deficiency, unspecified: Secondary | ICD-10-CM | POA: Diagnosis not present

## 2022-10-07 DIAGNOSIS — Z1321 Encounter for screening for nutritional disorder: Secondary | ICD-10-CM | POA: Diagnosis not present

## 2022-10-07 DIAGNOSIS — Z1329 Encounter for screening for other suspected endocrine disorder: Secondary | ICD-10-CM | POA: Diagnosis not present

## 2022-10-21 DIAGNOSIS — Z1329 Encounter for screening for other suspected endocrine disorder: Secondary | ICD-10-CM | POA: Diagnosis not present

## 2022-10-21 DIAGNOSIS — F17290 Nicotine dependence, other tobacco product, uncomplicated: Secondary | ICD-10-CM | POA: Diagnosis not present

## 2022-10-21 DIAGNOSIS — E559 Vitamin D deficiency, unspecified: Secondary | ICD-10-CM | POA: Diagnosis not present

## 2022-10-21 DIAGNOSIS — Z7189 Other specified counseling: Secondary | ICD-10-CM | POA: Diagnosis not present
